# Patient Record
Sex: Male | Born: 1975
Health system: Southern US, Community
[De-identification: ages and names within clinical notes are randomized; demographics above are authoritative.]

## PROBLEM LIST (undated history)

## (undated) ENCOUNTER — Emergency Department (HOSPITAL_COMMUNITY): Payer: 59

## (undated) DIAGNOSIS — Z87442 Personal history of urinary calculi: Secondary | ICD-10-CM

## (undated) DIAGNOSIS — K5792 Diverticulitis of intestine, part unspecified, without perforation or abscess without bleeding: Secondary | ICD-10-CM

## (undated) HISTORY — DX: Diverticulitis of intestine, part unspecified, without perforation or abscess without bleeding: K57.92

## (undated) HISTORY — PX: WISDOM TOOTH EXTRACTION: SHX21

---

## 2018-06-17 ENCOUNTER — Other Ambulatory Visit: Payer: Self-pay

## 2018-06-17 ENCOUNTER — Emergency Department (HOSPITAL_COMMUNITY): Payer: BLUE CROSS/BLUE SHIELD

## 2018-06-17 ENCOUNTER — Encounter (HOSPITAL_COMMUNITY): Payer: Self-pay | Admitting: Emergency Medicine

## 2018-06-17 ENCOUNTER — Emergency Department (HOSPITAL_COMMUNITY)
Admission: EM | Admit: 2018-06-17 | Discharge: 2018-06-17 | Disposition: A | Discharge status: Home / Self Care | Payer: BLUE CROSS/BLUE SHIELD | Attending: Emergency Medicine | Admitting: Emergency Medicine

## 2018-06-17 DIAGNOSIS — F17210 Nicotine dependence, cigarettes, uncomplicated: Secondary | ICD-10-CM | POA: Diagnosis not present

## 2018-06-17 DIAGNOSIS — N132 Hydronephrosis with renal and ureteral calculous obstruction: Secondary | ICD-10-CM | POA: Diagnosis not present

## 2018-06-17 DIAGNOSIS — R1012 Left upper quadrant pain: Secondary | ICD-10-CM | POA: Diagnosis not present

## 2018-06-17 DIAGNOSIS — N201 Calculus of ureter: Secondary | ICD-10-CM | POA: Diagnosis not present

## 2018-06-17 LAB — URINALYSIS, ROUTINE W REFLEX MICROSCOPIC
Bilirubin Urine: NEGATIVE
Glucose, UA: NEGATIVE mg/dL
Ketones, ur: 5 mg/dL — AB
Leukocytes,Ua: NEGATIVE
Nitrite: NEGATIVE
Protein, ur: 30 mg/dL — AB
RBC / HPF: >50 RBC/hpf — ABNORMAL HIGH (ref 0–5)
Specific Gravity, Urine: 1.025 (ref 1.005–1.030)
pH: 8 (ref 5.0–8.0)

## 2018-06-17 MED ORDER — CEPHALEXIN 500 MG PO CAPS
500.0000 mg | ORAL_CAPSULE | Freq: Once | ORAL | Status: AC
  Administered 2018-06-17: 500 mg via ORAL
  Filled 2018-06-17: qty 1

## 2018-06-17 MED ORDER — HYDROMORPHONE HCL 1 MG/ML IJ SOLN
1.0000 mg | Freq: Once | INTRAMUSCULAR | Status: AC
  Administered 2018-06-17: 22:00:00 1 mg via INTRAVENOUS
  Filled 2018-06-17: qty 1

## 2018-06-17 MED ORDER — OXYCODONE-ACETAMINOPHEN 5-325 MG PO TABS: 1.0000 | ORAL_TABLET | ORAL | 0 refills | Status: DC | PRN

## 2018-06-17 MED ORDER — CEPHALEXIN 500 MG PO CAPS: 500.0000 mg | ORAL_CAPSULE | Freq: Four times a day (QID) | ORAL | 0 refills | Status: DC

## 2018-06-17 MED ORDER — HYDROMORPHONE HCL 1 MG/ML IJ SOLN
1.0000 mg | Freq: Once | INTRAMUSCULAR | Status: AC
  Administered 2018-06-17: 1 mg via INTRAVENOUS
  Filled 2018-06-17: qty 1

## 2018-06-17 MED ORDER — ONDANSETRON HCL 4 MG/2ML IJ SOLN
4.0000 mg | Freq: Once | INTRAMUSCULAR | Status: AC
  Administered 2018-06-17: 21:00:00 4 mg via INTRAVENOUS
  Filled 2018-06-17: qty 2

## 2018-06-17 MED ORDER — TAMSULOSIN HCL 0.4 MG PO CAPS: 0.4000 mg | ORAL_CAPSULE | Freq: Every day | ORAL | 0 refills | Status: DC

## 2018-06-17 MED ORDER — ONDANSETRON HCL 4 MG PO TABS: 4.0000 mg | ORAL_TABLET | Freq: Four times a day (QID) | ORAL | 0 refills | Status: DC

## 2018-06-17 MED ORDER — KETOROLAC TROMETHAMINE 30 MG/ML IJ SOLN
30.0000 mg | Freq: Once | INTRAMUSCULAR | Status: AC
  Administered 2018-06-17: 30 mg via INTRAVENOUS
  Filled 2018-06-17: qty 1

## 2018-06-17 NOTE — ED Notes (Signed)
Urine strainer and specimen cup given to patient.

## 2018-06-17 NOTE — ED Provider Notes (Signed)
[facility] EMERGENCY DEPARTMENT Provider Note   CSN: [id number] Arrival date & time: 06/17/18  2039    History   Chief Complaint Chief Complaint  Patient presents with  . Flank Pain    HPI Wayne Peterson is a 43 y.o. male.     HPI   Wayne Peterson is a 43 y.o. male who presents to the Emergency Department complaining of sudden onset of left flank pain this evening at 7:00 pm.  He describes the pain as constant and sharp and also associated with nausea.  He noticed some "pressure" to his testicles associated with urination.  He denies injury, hematuria, difficulty with urination and history of kidney stones.  Nothing makes his symptoms better or worse.  He has not tried any over the counter analgesics prior to arrival.       History reviewed. No pertinent past medical history.  There are no active problems to display for this patient.   History reviewed. No pertinent surgical history.    Home Medications    Prior to Admission medications   Not on File    Family History No family history on file.  Social History Social History   Tobacco Use  . Smoking status: Current Every Day Smoker    Types: Cigarettes  . Smokeless tobacco: Never Used  Substance Use Topics  . Alcohol use: Not Currently  . Drug use: Not Currently     Allergies   Penicillins   Review of Systems Review of Systems  Constitutional: Negative for chills and fever.  Respiratory: Negative for chest tightness and shortness of breath.   Cardiovascular: Negative for chest pain.  Gastrointestinal: Positive for nausea. Negative for abdominal pain and vomiting.  Genitourinary: Positive for flank pain and hematuria. Negative for decreased urine volume, penile swelling, scrotal swelling and testicular pain.  Skin: Negative for rash.  Neurological: Negative for dizziness, weakness and numbness.     Physical Exam Updated Vital Signs BP 126/85 (BP Location: Left Arm)   Pulse 66   Temp 98  F (36.7 C) (Oral)   Resp 18   Ht 5\' 10"  (1.778 m)   Wt 88.5 kg   SpO2 99%   BMI 27.98 kg/m   Physical Exam Vitals signs and nursing note reviewed.  Constitutional:      Comments: Pt is uncomfortable appearing.    Cardiovascular:     Rate and Rhythm: Normal rate and regular rhythm.  Pulmonary:     Effort: Pulmonary effort is normal.     Breath sounds: Normal breath sounds.  Abdominal:     Palpations: Abdomen is soft. There is no mass.     Tenderness: There is no abdominal tenderness. There is no left CVA tenderness.     Comments: Pt points to left flank area as source of pain.  No CVA tenderness.    Musculoskeletal:     Right lower leg: No edema.     Left lower leg: No edema.  Skin:    General: Skin is warm.     Capillary Refill: Capillary refill takes less than 2 seconds.     Findings: No rash.  Neurological:     General: No focal deficit present.     Mental Status: He is alert.      ED Treatments / Results  Labs (all labs ordered are listed, but only abnormal results are displayed) Labs Reviewed  URINALYSIS, ROUTINE W REFLEX MICROSCOPIC - Abnormal; Notable for the following components:  Result Value   APPearance HAZY (*)    Hgb urine dipstick LARGE (*)    Ketones, ur 5 (*)    Protein, ur 30 (*)    RBC / HPF >50 (*)    Bacteria, UA RARE (*)    All other components within normal limits  URINE CULTURE    EKG None  Radiology Ct Renal Stone Study  Result Date: 06/17/2018 CLINICAL DATA:  Left flank pain. EXAM: CT ABDOMEN AND PELVIS WITHOUT CONTRAST TECHNIQUE: Multidetector CT imaging of the abdomen and pelvis was performed following the standard protocol without IV contrast. COMPARISON:  None. FINDINGS: Lower chest: Minor left lower lobe atelectasis. Hepatobiliary: No focal liver abnormality is seen. No gallstones, gallbladder wall thickening, or biliary dilatation. Pancreas: No ductal dilatation or inflammation. Spleen: Normal in size without focal  abnormality. Adrenals/Urinary Tract: Normal adrenal glands. 3 mm obstructing stone at the left ureterovesicular junction with moderate hydroureteronephrosis. Possible underlying extrarenal pelvis configuration with renal pelvis dilatation out of proportion to calyceal dilatation. Mild left perinephric edema. There is a dependent 12 x 14 mm nonobstructing stone in the mid renal pelvis, as well as additional smaller nonobstructing intrarenal stones. Probable extrarenal pelvis of the right kidney without calyceal dilatation. No right perinephric edema or renal stones. Right ureters decompressed. Urinary bladder partially distended. No bladder stone. Stomach/Bowel: Stomach is distended with ingested contents. Appendix appears normal. No evidence of bowel wall thickening, distention, or inflammatory changes. Minimal distal diverticulosis without diverticulitis. Vascular/Lymphatic: Normal caliber abdominal aorta. No enlarged lymph nodes in the abdomen or pelvis. Reproductive: Prostate is unremarkable. Other: No free air free fluid. Small amount of fat in both inguinal canals. Musculoskeletal: There are no acute or suspicious osseous abnormalities. IMPRESSION: 1. Obstructing 3 mm stone at the left ureterovesicular junction with moderate hydroureteronephrosis. 2. Additional nonobstructing stones in the left kidney, including a 12 x 14 mm dependent stone in the renal pelvis. 3. Probable extrarenal pelvis configuration of both kidneys. 4. Minor distal colonic diverticulosis without diverticulitis. Electronically Signed   By: Keith Rake M.D.   On: 06/17/2018 21:44    Procedures Procedures (including critical care time)  Medications Ordered in ED Medications  HYDROmorphone (DILAUDID) injection 1 mg (1 mg Intravenous Given 06/17/18 2104)  ondansetron (ZOFRAN) injection 4 mg (4 mg Intravenous Given 06/17/18 2104)  HYDROmorphone (DILAUDID) injection 1 mg (1 mg Intravenous Given 06/17/18 2144)  ketorolac (TORADOL) 30  MG/ML injection 30 mg (30 mg Intravenous Given 06/17/18 2211)     Initial Impression / Assessment and Plan / ED Course  I have reviewed the triage vital signs and the nursing notes.  Pertinent labs & imaging results that were available during my care of the patient were reviewed by me and considered in my medical decision making (see chart for details).        2220  On recheck, pt reports feeling much better and now rates pain at "0"  Nausea also resolved.  I have discussed CT findings with him and given the presence of a large stone in the renal pelvis, I have recommended that he f/u with urology.  rx written for zofran, percocet and flomax.   Urine strainer dispensed and strict return precautions discussed.    U/a shows some WBC's and rare bacteria.  Urine culture pending.  Will start keflex.    Final Clinical Impressions(s) / ED Diagnoses   Final diagnoses:  Left ureteral stone    ED Discharge Orders    None  Kem Parkinson, PA-C 06/17/18 2347    Fredia Sorrow, MD 06/20/18 [phone]

## 2018-06-17 NOTE — ED Notes (Signed)
Patient returned from CT scan.

## 2018-06-17 NOTE — Discharge Instructions (Signed)
Take the medications as directed.  Strain all urine.  Call the urologist listed on Monday to arrange a follow-up appt.  Return here for any worsening symptoms

## 2018-06-17 NOTE — ED Triage Notes (Signed)
Pt with c/o L. Flank pain that started around 7pm tonight. Pt also c/o nausea.

## 2018-06-20 MED FILL — Oxycodone w/ Acetaminophen Tab 5-325 MG: ORAL | Qty: 6 | Status: AC

## 2018-06-21 DIAGNOSIS — N130 Hydronephrosis with ureteropelvic junction obstruction: Secondary | ICD-10-CM | POA: Diagnosis not present

## 2018-06-21 DIAGNOSIS — N202 Calculus of kidney with calculus of ureter: Secondary | ICD-10-CM | POA: Diagnosis not present

## 2018-07-19 DIAGNOSIS — N130 Hydronephrosis with ureteropelvic junction obstruction: Secondary | ICD-10-CM | POA: Diagnosis not present

## 2018-07-19 DIAGNOSIS — N202 Calculus of kidney with calculus of ureter: Secondary | ICD-10-CM | POA: Diagnosis not present

## 2018-07-21 ENCOUNTER — Other Ambulatory Visit: Payer: Self-pay | Admitting: Urology

## 2018-08-23 ENCOUNTER — Other Ambulatory Visit (HOSPITAL_COMMUNITY)
Admission: RE | Admit: 2018-08-23 | Discharge: 2018-08-23 | Disposition: A | Discharge status: Home / Self Care | Payer: BC Managed Care – PPO | Source: Ambulatory Visit | Attending: Urology | Admitting: Urology

## 2018-08-23 ENCOUNTER — Other Ambulatory Visit (HOSPITAL_COMMUNITY): Payer: BC Managed Care – PPO

## 2018-08-23 DIAGNOSIS — Z1159 Encounter for screening for other viral diseases: Secondary | ICD-10-CM | POA: Insufficient documentation

## 2018-08-24 ENCOUNTER — Encounter (HOSPITAL_BASED_OUTPATIENT_CLINIC_OR_DEPARTMENT_OTHER): Payer: Self-pay | Admitting: *Deleted

## 2018-08-24 ENCOUNTER — Other Ambulatory Visit: Payer: Self-pay

## 2018-08-24 LAB — NOVEL CORONAVIRUS, NAA (HOSP ORDER, SEND-OUT TO REF LAB; TAT 18-24 HRS): SARS-CoV-2, NAA: NOT DETECTED

## 2018-08-24 NOTE — Progress Notes (Signed)
Spoke with Ledger Npo after midnight  arrive 1040 am 08-26-2018 [facility] No meds to take Has surgery orders in epic  I stat 8 needed Driver wife erica cell [phone]

## 2018-08-26 ENCOUNTER — Encounter (HOSPITAL_BASED_OUTPATIENT_CLINIC_OR_DEPARTMENT_OTHER): Payer: Self-pay | Admitting: Emergency Medicine

## 2018-08-26 ENCOUNTER — Ambulatory Visit (HOSPITAL_BASED_OUTPATIENT_CLINIC_OR_DEPARTMENT_OTHER): Payer: BC Managed Care – PPO | Admitting: Anesthesiology

## 2018-08-26 ENCOUNTER — Ambulatory Visit (HOSPITAL_BASED_OUTPATIENT_CLINIC_OR_DEPARTMENT_OTHER)
Admission: RE | Admit: 2018-08-26 | Discharge: 2018-08-26 | Disposition: A | Discharge status: Home / Self Care | Payer: BC Managed Care – PPO | Attending: Urology | Admitting: Urology

## 2018-08-26 ENCOUNTER — Other Ambulatory Visit: Payer: Self-pay

## 2018-08-26 ENCOUNTER — Encounter (HOSPITAL_BASED_OUTPATIENT_CLINIC_OR_DEPARTMENT_OTHER)
Admission: RE | Disposition: A | Discharge status: Home / Self Care | Payer: Self-pay | Source: Home / Self Care | Attending: Urology

## 2018-08-26 DIAGNOSIS — N202 Calculus of kidney with calculus of ureter: Secondary | ICD-10-CM | POA: Diagnosis not present

## 2018-08-26 DIAGNOSIS — N132 Hydronephrosis with renal and ureteral calculous obstruction: Secondary | ICD-10-CM | POA: Diagnosis not present

## 2018-08-26 DIAGNOSIS — F17210 Nicotine dependence, cigarettes, uncomplicated: Secondary | ICD-10-CM | POA: Diagnosis not present

## 2018-08-26 HISTORY — PX: CYSTOSCOPY WITH RETROGRADE PYELOGRAM, URETEROSCOPY AND STENT PLACEMENT: SHX5789

## 2018-08-26 HISTORY — DX: Personal history of urinary calculi: Z87.442

## 2018-08-26 HISTORY — PX: HOLMIUM LASER APPLICATION: SHX5852

## 2018-08-26 LAB — POCT I-STAT, CHEM 8
BUN: 7 mg/dL (ref 6–20)
Calcium, Ion: 1.25 mmol/L (ref 1.15–1.40)
Chloride: 105 mmol/L (ref 98–111)
Creatinine, Ser: 1 mg/dL (ref 0.61–1.24)
Glucose, Bld: 97 mg/dL (ref 70–99)
HCT: 43 % (ref 39.0–52.0)
Hemoglobin: 14.6 g/dL (ref 13.0–17.0)
Potassium: 4 mmol/L (ref 3.5–5.1)
Sodium: 140 mmol/L (ref 135–145)
TCO2: 24 mmol/L (ref 22–32)

## 2018-08-26 SURGERY — CYSTOURETEROSCOPY, WITH RETROGRADE PYELOGRAM AND STENT INSERTION
Anesthesia: General | Site: Urethra | Laterality: Left

## 2018-08-26 MED ORDER — CIPROFLOXACIN HCL 500 MG PO TABS: 500.0000 mg | ORAL_TABLET | Freq: Two times a day (BID) | ORAL | 0 refills | Status: AC

## 2018-08-26 MED ORDER — LIDOCAINE 2% (20 MG/ML) 5 ML SYRINGE
INTRAMUSCULAR | Status: DC | PRN
  Administered 2018-08-26: 100 mg via INTRAVENOUS

## 2018-08-26 MED ORDER — KETOROLAC TROMETHAMINE 30 MG/ML IJ SOLN
INTRAMUSCULAR | Status: DC | PRN
  Administered 2018-08-26: 30 mg via INTRAVENOUS

## 2018-08-26 MED ORDER — LIDOCAINE 2% (20 MG/ML) 5 ML SYRINGE
INTRAMUSCULAR | Status: AC
  Filled 2018-08-26: qty 5

## 2018-08-26 MED ORDER — GENTAMICIN SULFATE 40 MG/ML IJ SOLN
5.0000 mg/kg | INTRAVENOUS | Status: DC
  Filled 2018-08-26: qty 11

## 2018-08-26 MED ORDER — GENTAMICIN SULFATE 40 MG/ML IJ SOLN
5.0000 mg/kg | INTRAVENOUS | Status: AC
  Administered 2018-08-26: 400.4 mg via INTRAVENOUS
  Filled 2018-08-26: qty 10

## 2018-08-26 MED ORDER — MIDAZOLAM HCL 2 MG/2ML IJ SOLN
INTRAMUSCULAR | Status: AC
  Filled 2018-08-26: qty 2

## 2018-08-26 MED ORDER — FENTANYL CITRATE (PF) 100 MCG/2ML IJ SOLN
25.0000 ug | INTRAMUSCULAR | Status: DC | PRN
  Filled 2018-08-26: qty 1

## 2018-08-26 MED ORDER — PROPOFOL 10 MG/ML IV BOLUS
INTRAVENOUS | Status: AC
  Filled 2018-08-26: qty 20

## 2018-08-26 MED ORDER — OXYCODONE-ACETAMINOPHEN 5-325 MG PO TABS: 1.0000 | ORAL_TABLET | Freq: Four times a day (QID) | ORAL | 0 refills | Status: DC | PRN

## 2018-08-26 MED ORDER — DEXAMETHASONE SODIUM PHOSPHATE 10 MG/ML IJ SOLN
INTRAMUSCULAR | Status: AC
  Filled 2018-08-26: qty 1

## 2018-08-26 MED ORDER — PROPOFOL 10 MG/ML IV BOLUS
INTRAVENOUS | Status: DC | PRN
  Administered 2018-08-26: 200 mg via INTRAVENOUS

## 2018-08-26 MED ORDER — LACTATED RINGERS IV SOLN
INTRAVENOUS | Status: DC
  Administered 2018-08-26: 12:00:00 via INTRAVENOUS
  Filled 2018-08-26: qty 1000

## 2018-08-26 MED ORDER — FENTANYL CITRATE (PF) 100 MCG/2ML IJ SOLN
INTRAMUSCULAR | Status: AC
  Filled 2018-08-26: qty 2

## 2018-08-26 MED ORDER — MIDAZOLAM HCL 5 MG/5ML IJ SOLN
INTRAMUSCULAR | Status: DC | PRN
  Administered 2018-08-26: 2 mg via INTRAVENOUS

## 2018-08-26 MED ORDER — DEXAMETHASONE SODIUM PHOSPHATE 10 MG/ML IJ SOLN
INTRAMUSCULAR | Status: DC | PRN
  Administered 2018-08-26: 10 mg via INTRAVENOUS

## 2018-08-26 MED ORDER — OXYCODONE HCL 5 MG PO TABS
5.0000 mg | ORAL_TABLET | Freq: Once | ORAL | Status: DC | PRN
  Filled 2018-08-26: qty 1

## 2018-08-26 MED ORDER — KETOROLAC TROMETHAMINE 30 MG/ML IJ SOLN
INTRAMUSCULAR | Status: AC
  Filled 2018-08-26: qty 1

## 2018-08-26 MED ORDER — FENTANYL CITRATE (PF) 100 MCG/2ML IJ SOLN
INTRAMUSCULAR | Status: DC | PRN
  Administered 2018-08-26 (×2): 50 ug via INTRAVENOUS

## 2018-08-26 MED ORDER — SENNOSIDES-DOCUSATE SODIUM 8.6-50 MG PO TABS: 1.0000 | ORAL_TABLET | Freq: Two times a day (BID) | ORAL | 0 refills | Status: DC

## 2018-08-26 MED ORDER — OXYCODONE HCL 5 MG/5ML PO SOLN
5.0000 mg | Freq: Once | ORAL | Status: DC | PRN
  Filled 2018-08-26: qty 5

## 2018-08-26 MED ORDER — IOHEXOL 300 MG/ML  SOLN
INTRAMUSCULAR | Status: DC | PRN
  Administered 2018-08-26: 13:00:00 20 mL via URETHRAL

## 2018-08-26 MED ORDER — ONDANSETRON HCL 4 MG/2ML IJ SOLN
INTRAMUSCULAR | Status: AC
  Filled 2018-08-26: qty 2

## 2018-08-26 MED ORDER — ONDANSETRON HCL 4 MG/2ML IJ SOLN
4.0000 mg | Freq: Once | INTRAMUSCULAR | Status: DC | PRN
  Filled 2018-08-26: qty 2

## 2018-08-26 MED ORDER — KETOROLAC TROMETHAMINE 10 MG PO TABS: 10.0000 mg | ORAL_TABLET | Freq: Four times a day (QID) | ORAL | 0 refills | Status: DC | PRN

## 2018-08-26 MED ORDER — ONDANSETRON HCL 4 MG/2ML IJ SOLN
INTRAMUSCULAR | Status: DC | PRN
  Administered 2018-08-26: 4 mg via INTRAVENOUS

## 2018-08-26 SURGICAL SUPPLY — 28 items
BAG DRAIN URO-CYSTO SKYTR STRL (DRAIN) ×4 IMPLANT
BASKET LASER NITINOL 1.9FR (BASKET) ×4 IMPLANT
CATH INTERMIT  6FR 70CM (CATHETERS) ×4 IMPLANT
CLOTH BEACON ORANGE TIMEOUT ST (SAFETY) ×4 IMPLANT
FIBER LASER FLEXIVA 365 (UROLOGICAL SUPPLIES) IMPLANT
FIBER LASER TRAC TIP (UROLOGICAL SUPPLIES) ×4 IMPLANT
GLOVE BIO SURGEON STRL SZ7.5 (GLOVE) ×8 IMPLANT
GLOVE BIOGEL PI IND STRL 6.5 (GLOVE) ×2 IMPLANT
GLOVE BIOGEL PI IND STRL 7.5 (GLOVE) ×2 IMPLANT
GLOVE BIOGEL PI INDICATOR 6.5 (GLOVE) ×2
GLOVE BIOGEL PI INDICATOR 7.5 (GLOVE) ×2
GOWN STRL REUS W/TWL LRG LVL3 (GOWN DISPOSABLE) ×12 IMPLANT
GUIDEWIRE ANG ZIPWIRE 038X150 (WIRE) ×4 IMPLANT
GUIDEWIRE STR DUAL SENSOR (WIRE) ×4 IMPLANT
IV NS 1000ML (IV SOLUTION)
IV NS 1000ML BAXH (IV SOLUTION) IMPLANT
IV NS IRRIG 3000ML ARTHROMATIC (IV SOLUTION) ×8 IMPLANT
KIT TURNOVER CYSTO (KITS) ×4 IMPLANT
MANIFOLD NEPTUNE II (INSTRUMENTS) ×4 IMPLANT
NS IRRIG 500ML POUR BTL (IV SOLUTION) ×4 IMPLANT
PACK CYSTO (CUSTOM PROCEDURE TRAY) ×4 IMPLANT
SHEATH URETERAL 12FRX35CM (MISCELLANEOUS) ×4 IMPLANT
STENT POLARIS 5FRX26 (STENTS) ×4 IMPLANT
SYR 10ML LL (SYRINGE) ×4 IMPLANT
TUBE CONNECTING 12'X1/4 (SUCTIONS) ×1
TUBE CONNECTING 12X1/4 (SUCTIONS) ×3 IMPLANT
TUBE FEEDING 8FR 16IN STR KANG (MISCELLANEOUS) ×4 IMPLANT
TUBING UROLOGY SET (TUBING) ×4 IMPLANT

## 2018-08-26 NOTE — Progress Notes (Signed)
Discharge instructions started at 1500 and completed prior to discharge.

## 2018-08-26 NOTE — Transfer of Care (Signed)
Immediate Anesthesia Transfer of Care Note  Patient: Wayne Peterson  Procedure(s) Performed: CYSTOSCOPY WITH BILATERAL RETROGRADE PYELOGRAM, LEFT URETEROSCOPY AND LEFT STENT PLACEMENT (Urethra) HOLMIUM LASER APPLICATION (Left Urethra)  Patient Location: PACU  Anesthesia Type:General  Level of Consciousness: awake, alert  and oriented  Airway & Oxygen Therapy: Patient Spontanous Breathing and Patient connected to nasal cannula oxygen  Post-op Assessment: Report given to RN  Post vital signs: Reviewed and stable  Last Vitals: 134/80, 18, 98.2 Vitals Value Taken Time  BP    Temp    Pulse 77 08/26/18 1406  Resp    SpO2 98 % 08/26/18 1406  Vitals shown include unvalidated device data.  Last Pain:  Vitals:   08/26/18 1142  TempSrc: Oral      Patients Stated Pain Goal: 4 (29/56/21 3086)  Complications: No apparent anesthesia complications

## 2018-08-26 NOTE — Anesthesia Preprocedure Evaluation (Signed)
Anesthesia Evaluation  Patient identified by MRN, date of birth, ID band Patient awake    Reviewed: Allergy & Precautions, NPO status , Patient's Chart, lab work & pertinent test results  History of Anesthesia Complications Negative for: history of anesthetic complications  Airway Mallampati: III  TM Distance: >3 FB Neck ROM: Full    Dental  (+) Teeth Intact   Pulmonary neg pulmonary ROS, Current Smoker,    Pulmonary exam normal        Cardiovascular negative cardio ROS Normal cardiovascular exam     Neuro/Psych negative neurological ROS  negative psych ROS   GI/Hepatic negative GI ROS, Neg liver ROS,   Endo/Other  negative endocrine ROS  Renal/GU negative Renal ROS  negative genitourinary   Musculoskeletal negative musculoskeletal ROS (+)   Abdominal   Peds  Hematology negative hematology ROS (+)   Anesthesia Other Findings   Reproductive/Obstetrics                             Anesthesia Physical Anesthesia Plan  ASA: I  Anesthesia Plan: General   Post-op Pain Management:    Induction: Intravenous  PONV Risk Score and Plan: 2 and Ondansetron, Dexamethasone, Midazolam and Treatment may vary due to age or medical condition  Airway Management Planned: LMA  Additional Equipment: None  Intra-op Plan:   Post-operative Plan: Extubation in OR  Informed Consent: I have reviewed the patients History and Physical, chart, labs and discussed the procedure including the risks, benefits and alternatives for the proposed anesthesia with the patient or authorized representative who has indicated his/her understanding and acceptance.     Dental advisory given  Plan Discussed with:   Anesthesia Plan Comments:         Anesthesia Quick Evaluation

## 2018-08-26 NOTE — Anesthesia Postprocedure Evaluation (Signed)
Anesthesia Post Note  Patient: Wayne Peterson  Procedure(s) Performed: CYSTOSCOPY WITH BILATERAL RETROGRADE PYELOGRAM, LEFT URETEROSCOPY AND LEFT STENT PLACEMENT (Urethra) HOLMIUM LASER APPLICATION (Left Urethra)     Patient location during evaluation: PACU Anesthesia Type: General Level of consciousness: awake and alert Pain management: pain level controlled Vital Signs Assessment: post-procedure vital signs reviewed and stable Respiratory status: spontaneous breathing, nonlabored ventilation and respiratory function stable Cardiovascular status: blood pressure returned to baseline and stable Postop Assessment: no apparent nausea or vomiting Anesthetic complications: no    Last Vitals:  Vitals:   08/26/18 1404 08/26/18 1415  BP: 134/80 121/78  Pulse: 74 65  Resp: 16 17  Temp: 36.8 C   SpO2: 99% 100%    Last Pain:  Vitals:   08/26/18 1404  TempSrc:   PainSc: 0-No pain                 Lidia Collum

## 2018-08-26 NOTE — Op Note (Signed)
NAME: Wayne Peterson, Wayne Peterson MEDICAL RECORD [id number] ACCOUNT [id number] DATE OF BIRTH:08-25-1975 FACILITY: [facility] LOCATION: [facility] PHYSICIAN:Caya Soberanis, MD  OPERATIVE REPORT  DATE OF PROCEDURE:  08/26/2018  PREOPERATIVE DIAGNOSIS:  Left distal ureteral stone and large left renal stone.  POSTOPERATIVE DIAGNOSIS:  Left distal ureteral stone and large left renal stone.  PROCEDURE: 1.  Cystoscopy, left retrograde pyelogram, interpretation. 2.  Left ureteroscopy with laser lithotripsy. 3.  Insertion of left ureteral stent 5 x 26 Polaris, no tether. 4.  Right retrograde pyelogram.  ESTIMATED BLOOD LOSS:  Nil.  COMPLICATIONS:  None.  SPECIMENS:  Left renal and ureteral stone fragments for composition analysis.  FINDINGS: 1.  Left greater than right partial UPJ obstruction, not high grade. 2.  Left distal ureteral stone, large left intrarenal stone. 3.  Complete resolution of all accessible stone fragments larger than 130 mm following laser lithotripsy and basket extraction. 4.  Successful placement of left ureteral stent, proximal end in pelvis, distal end in urinary bladder, no tether.  INDICATIONS:  The patient is a pleasant 43 year old gentleman who was found on workup for colicky flank pain to have a distal left ureteral stone approximately 3 mm and a significant left renal stone over a centimeter.  He also had a left greater than  right extrarenal pelvis consistent with likely partial UPJ obstruction, but no history of compromised renal function.  Options were discussed for management including medical therapy alone versus lithotripsy versus ureteroscopy, and he wished to proceed  with the latter with left side stone free his goal.  Informed consent was obtained and placed in the medical record.  PROCEDURE IN DETAIL:  The patient being identified, the right being left ureteroscopic stimulation and right retrograde pyelogram was confirmed.  Procedure time-out was performed.   Intravenous antibiotics were administered.  General anesthesia was  induced.  The patient was placed into a low lithotomy position.  A sterile field was created by prepping and draping the base of the penis, perineum and proximal thighs using iodine.  Cystourethroscopy was performed with a 21-French rigid cystoscope with  offset lens.  Inspection of anterior and posterior urethra was unremarkable.  Inspection of bladder revealed bilateral ureteral orifices.  The left ureteral orifice was somewhat inflamed with some surrounding edema consistent with likely persistence of  left distal ureteral stone.  The right ureteral orifice was cannulated with a 6-French renal catheter, and right retrograde pyelogram was obtained.  Right retrograde pyelogram demonstrated a single right ureter, single-system right kidney.  There was some caliectasis without filling defects or narrowing consistent with likely partial right UPJ.  Similarly, left retrograde pyelogram was obtained.  Left retrograde pyelogram demonstrated a single left ureter, single-system left kidney.  There was mild hydroureteronephrosis to the level of the distal ureter with a filling defect in the intramural distal ureter consistent with known stone.  There was  a calcification turned filling defect in the renal pelvis consistent with a large renal stone.  There was more impressive likely partial UPJ physiology on the left.  An 0.03 ZIPwire was advanced to the level of the upper pole and set aside as a safety  wire and eventually be placed in the urinary bladder for pressure release.  Semirigid ureteroscopy of the distal left ureter alongside a separate sensor working wire.  As expected, within the proximal intramural ureter, there was an impacted stone.  It  was just too large for simple basketing.  As such, holmium laser energy applied at settings of 0.2 joules  and 20 Hz and fragmented into smaller pieces that were then sequentially grasped and removed  and set aside for comp analysis.  Repeat semirigid  ureteroscopy of the distal four-fifths of the left ureter revealed no additional calcifications or significant mucosal lesions.  The semirigid scope was then exchanged for a 12/14 medium length ureteral access sheath using continuous fluoroscopic  guidance to the level of the proximal left ureter, and flexible digital ureteroscopy performed on the proximal left ureter and systematic inspection of the kidney using dual-channel flexible digital ureteroscope.  There was a dominant intrarenal calculus  noted within the renal pelvis, likely intermittent ball-valving.  This was much too large for simple basketing.  It was basketed and repositioned into an upper pole calix for less acute angulation, and holmium laser energy was applied to the stone using  a variety of settings, beginning at 0.2 joules and 20 Hz and ending at 0.4 joules and 30 Hz.  Using a dusting technique, the entire stone was ablated into fragments 130 mm or less in diameter.  Representative fragments were basketed with the escape  basket and removed and set aside for composition analysis.  Following this, excellent hemostasis was complete with resolution of all successful stone fragments larger than 130 mm.  An access sheath was removed under continuous vision, and again no  evidence of high-grade obstruction was seen.  Given dusting technique and large stone, it was felt that interval stenting with a nontethered stent would be warranted.  As such, a new 5 x 26 Polaris-type stent was placed with remaining safety wire using  fluoroscopic guidance.  Good proximal and distal planes were noted, and the procedure was terminated.  The patient tolerated the procedure well.  No immediate perioperative complications.  The patient was taken to the postanesthesia care unit in stable  condition with plan for discharge home.  LN/NUANCE  D:08/26/2018 T:08/26/2018 JOB:[id number]

## 2018-08-26 NOTE — Brief Op Note (Signed)
08/26/2018  1:58 PM  PATIENT:  Alcide Clever II  43 y.o. male  PRE-OPERATIVE DIAGNOSIS:  LEFT URETERAL / RENAL STONES  POST-OPERATIVE DIAGNOSIS:  LEFT URETERAL / RENAL STONE  PROCEDURE:  Procedure(s): CYSTOSCOPY WITH BILATERAL RETROGRADE PYELOGRAM, LEFT URETEROSCOPY AND LEFT STENT PLACEMENT HOLMIUM LASER APPLICATION (Left)  SURGEON:  Surgeon(s) and Role:    Alexis Frock, MD - Primary  PHYSICIAN ASSISTANT:   ASSISTANTS: none   ANESTHESIA:   general  EBL:  minimal   BLOOD ADMINISTERED:none  DRAINS: none   LOCAL MEDICATIONS USED:  NONE  SPECIMEN:  Source of Specimen:  left renal / ureteral stone fragments  DISPOSITION OF SPECIMEN:  [facility] for compositional analysis  COUNTS:  YES  TOURNIQUET:  * No tourniquets in log *  DICTATION: .Other Dictation: Dictation Number  [id number]  PLAN OF CARE: Discharge to home after PACU  PATIENT DISPOSITION:  PACU - hemodynamically stable.   Delay start of Pharmacological VTE agent (>24hrs) due to surgical blood loss or risk of bleeding: yes

## 2018-08-26 NOTE — H&P (Signed)
Urology Admission H&P  Chief Complaint: Pre-OP LEFT Ureteroscopic Stone Manipulation and BILATERAL retrogrades.   History of Present Illness:   1 - Left Ureteral / Renal Stones - Left 78mm UVJ + 75mm renal pelvis + 30mm lower pole stones by ER CT 06/2018 on eval flank pain. Density 900HU. Given tamsulosin, pain meds, nausea meds, strainer and pain now well controlle but no passage and persistent on KUB.   2 - L>R Bilateral UPJ Obstruction v. Extrarenal Pelvis - large L>R hydro to UPJ bilaterall by ER CT 06/2018. No prior renal function measures. No obvious crossing vessels.   PMH UNremarkable. NO prior surgeyr. NO CV disease / blood thinners. His PCP is [facility] in [place].   Today " Wayne Peterson " is seen to proceed with LEFT ureteroscopy and BILATERAL retrogrades. No intervl fevers. Most recent UA without infectious parameters.     Past Medical History:  Diagnosis Date  . History of kidney stones    Past Surgical History:  Procedure Laterality Date  . WISDOM TOOTH EXTRACTION      Home Medications:  No current facility-administered medications for this encounter.    Current Outpatient Medications  Medication Sig Dispense Refill Last Dose  . ondansetron (ZOFRAN) 4 MG tablet Take 1 tablet (4 mg total) by mouth every 6 (six) hours. 12 tablet 0   . oxyCODONE-acetaminophen (PERCOCET/ROXICET) 5-325 MG tablet Take 1 tablet by mouth every 4 (four) hours as needed for severe pain. 6 tablet 0    Allergies:  Allergies  Allergen Reactions  . Penicillins     n/v    History reviewed. No pertinent family history. Social History:  reports that he has been smoking cigarettes. He has a 11.50 pack-year smoking history. He has never used smokeless tobacco. He reports previous alcohol use. He reports previous drug use. Drug: Marijuana.  Review of Systems  Constitutional: Negative.  Negative for chills and fever.  Genitourinary: Positive for flank pain.  All other systems reviewed and are  negative.   Physical Exam:  Vital signs in last 24 hours:   Physical Exam  Constitutional: He appears well-developed.  Eyes: Pupils are equal, round, and reactive to light.  Neck: Normal range of motion.  Cardiovascular: Normal rate.  Respiratory: Effort normal.  GI: Soft.  Genitourinary:    Genitourinary Comments: Mild LEFT CVAT   Musculoskeletal: Normal range of motion.  Neurological: He is alert.  Skin: Skin is warm.    Laboratory Data:  No results found for this or any previous visit (from the past 24 hour(s)). Recent Results (from the past 240 hour(s))  Novel Coronavirus, NAA (hospital order; send-out to ref lab)     Status: None   Collection Time: 08/23/18  2:54 PM   Specimen: Nasopharyngeal Swab; Respiratory  Result Value Ref Range Status   SARS-CoV-2, NAA NOT DETECTED NOT DETECTED Final    Comment: (NOTE) This test was developed and its performance characteristics determined by [employer]. This test has not been FDA cleared or approved. This test has been authorized by FDA under an Emergency Use Authorization (EUA). This test is only authorized for the duration of time the declaration that circumstances exist justifying the authorization of the emergency use of in vitro diagnostic tests for detection of SARS-CoV-2 virus and/or diagnosis of COVID-19 infection under section 564(b)(1) of the Act, 21 U.S.C. 161WRU-0(A)(5), unless the authorization is terminated or revoked sooner. When diagnostic testing is negative, the possibility of a false negative result should be considered in the context of  a patient's recent exposures and the presence of clinical signs and symptoms consistent with COVID-19. An individual without symptoms of COVID-19 and who is not shedding SARS-CoV-2 virus would expect to have a negative (not detected) result in this assay. Performed  At: [facility] [address] [place], [place] [id number] Rush Farmer MD  JG:[id number]    [place]  Final    Comment: Performed at [facility], [place] [address], [place], [place] [address]     Plan:   Proceed as planned with LEFT ureteroscopy and BILATERAL retrogrades. Risks, benefits, alternatives, expected peri-op course discussed previously and reiteratd otday.   Alexis Frock 08/26/2018, 7:19 AM

## 2018-08-26 NOTE — Anesthesia Procedure Notes (Signed)
Procedure Name: LMA Insertion Date/Time: 08/26/2018 12:28 PM Performed by: Bonney Aid, CRNA Pre-anesthesia Checklist: Patient identified, Emergency Drugs available, Suction available and Patient being monitored Patient Re-evaluated:Patient Re-evaluated prior to induction Oxygen Delivery Method: Circle system utilized Preoxygenation: Pre-oxygenation with 100% oxygen Induction Type: IV induction Ventilation: Mask ventilation without difficulty LMA: LMA inserted LMA Size: 5.0 Number of attempts: 1 Airway Equipment and Method: Bite block Placement Confirmation: positive ETCO2 Tube secured with: Tape Dental Injury: Teeth and Oropharynx as per pre-operative assessment

## 2018-08-26 NOTE — Discharge Instructions (Signed)
1 - You may have urinary urgency (bladder spasms) and bloody urine on / off with stent in place. This is normal. ° °2 - Call MD or go to ER for fever >102, severe pain / nausea / vomiting not relieved by medications, or acute change in medical status ° °CYSTOSCOPY HOME CARE INSTRUCTIONS ° °Activity: °Rest for the remainder of the day.  Do not drive or operate equipment today.  You may resume normal activities in one to two days as instructed by your physician.  ° °Meals: °Drink plenty of liquids and eat light foods such as gelatin or soup this evening.  You may return to a normal meal plan tomorrow. ° °Return to Work: °You may return to work in one to two days or as instructed by your physician. ° °Special Instructions / Symptoms: °Call your physician if any of these symptoms occur: ° ° -persistent or heavy bleeding ° -bleeding which continues after first few urination ° -large blood clots that are difficult to pass ° -urine stream diminishes or stops completely ° -fever equal to or higher than 101 degrees Farenheit. ° -cloudy urine with a strong, foul odor ° -severe pain ° °Females should always wipe from front to back after elimination.  You may feel some burning pain when you urinate.  This should disappear with time.  Applying moist heat to the lower abdomen or a hot tub bath may help relieve the pain. \ ° °Follow-Up / Date of Return Visit to Your Physician:  As instructed °Call for an appointment to arrange follow-up. ° °Patient Signature:  ________________________________________________________ ° °Nurse's Signature:  ________________________________________________________ ° °Post Anesthesia Home Care Instructions ° °Activity: °Get plenty of rest for the remainder of the day. A responsible individual must stay with you for 24 hours following the procedure.  °For the next 24 hours, DO NOT: °-Drive a car °-Operate machinery °-Drink alcoholic beverages °-Take any medication unless instructed by your  physician °-Make any legal decisions or sign important papers. ° °Meals: °Start with liquid foods such as gelatin or soup. Progress to regular foods as tolerated. Avoid greasy, spicy, heavy foods. If nausea and/or vomiting occur, drink only clear liquids until the nausea and/or vomiting subsides. Call your physician if vomiting continues. ° °Special Instructions/Symptoms: °Your throat may feel dry or sore from the anesthesia or the breathing tube placed in your throat during surgery. If this causes discomfort, gargle with warm salt water. The discomfort should disappear within 24 hours. ° °If you had a scopolamine patch placed behind your ear for the management of post- operative nausea and/or vomiting: ° °1. The medication in the patch is effective for 72 hours, after which it should be removed.  Wrap patch in a tissue and discard in the trash. Wash hands thoroughly with soap and water. °2. You may remove the patch earlier than 72 hours if you experience unpleasant side effects which may include dry mouth, dizziness or visual disturbances. °3. Avoid touching the patch. Wash your hands with soap and water after contact with the patch. °   ° ° °

## 2018-08-29 ENCOUNTER — Encounter (HOSPITAL_BASED_OUTPATIENT_CLINIC_OR_DEPARTMENT_OTHER): Payer: Self-pay | Admitting: Urology

## 2018-09-01 DIAGNOSIS — N202 Calculus of kidney with calculus of ureter: Secondary | ICD-10-CM | POA: Diagnosis not present

## 2018-09-12 DIAGNOSIS — N202 Calculus of kidney with calculus of ureter: Secondary | ICD-10-CM | POA: Diagnosis not present

## 2018-09-12 DIAGNOSIS — N130 Hydronephrosis with ureteropelvic junction obstruction: Secondary | ICD-10-CM | POA: Diagnosis not present

## 2018-09-14 ENCOUNTER — Other Ambulatory Visit (HOSPITAL_COMMUNITY): Payer: Self-pay | Admitting: Urology

## 2018-09-14 ENCOUNTER — Other Ambulatory Visit: Payer: Self-pay | Admitting: Urology

## 2018-09-14 DIAGNOSIS — N310 Uninhibited neuropathic bladder, not elsewhere classified: Secondary | ICD-10-CM

## 2018-10-28 ENCOUNTER — Encounter (HOSPITAL_COMMUNITY): Payer: BC Managed Care – PPO

## 2018-10-28 ENCOUNTER — Encounter (HOSPITAL_COMMUNITY): Payer: Self-pay

## 2019-02-16 ENCOUNTER — Other Ambulatory Visit: Payer: Self-pay

## 2019-02-16 DIAGNOSIS — Z20822 Contact with and (suspected) exposure to covid-19: Secondary | ICD-10-CM

## 2019-02-17 LAB — NOVEL CORONAVIRUS, NAA: SARS-CoV-2, NAA: NOT DETECTED

## 2019-03-15 ENCOUNTER — Encounter (HOSPITAL_COMMUNITY): Payer: Self-pay | Admitting: Emergency Medicine

## 2019-03-15 ENCOUNTER — Ambulatory Visit (HOSPITAL_COMMUNITY)
Admission: EM | Admit: 2019-03-15 | Discharge: 2019-03-15 | Disposition: A | Discharge status: Home / Self Care | Payer: BC Managed Care – PPO | Attending: Family Medicine | Admitting: Family Medicine

## 2019-03-15 ENCOUNTER — Other Ambulatory Visit: Payer: Self-pay

## 2019-03-15 DIAGNOSIS — Z20828 Contact with and (suspected) exposure to other viral communicable diseases: Secondary | ICD-10-CM | POA: Diagnosis not present

## 2019-03-15 DIAGNOSIS — B349 Viral infection, unspecified: Secondary | ICD-10-CM | POA: Diagnosis not present

## 2019-03-15 DIAGNOSIS — U071 COVID-19: Secondary | ICD-10-CM | POA: Diagnosis not present

## 2019-03-15 DIAGNOSIS — F17210 Nicotine dependence, cigarettes, uncomplicated: Secondary | ICD-10-CM | POA: Insufficient documentation

## 2019-03-15 DIAGNOSIS — R6883 Chills (without fever): Secondary | ICD-10-CM

## 2019-03-15 DIAGNOSIS — R52 Pain, unspecified: Secondary | ICD-10-CM | POA: Diagnosis not present

## 2019-03-15 DIAGNOSIS — R509 Fever, unspecified: Secondary | ICD-10-CM

## 2019-03-15 NOTE — ED Provider Notes (Signed)
[place]   MRN: [id number] DOB: 1975/03/30  Subjective:   Wayne Peterson is a 44 y.o. male presenting for several day history of mild intermittent fever, chills, body aches.  Patient has had multiple COVID-19 contacts including his father-in-law, his daughter.  Has not needed to take medications for relief.   Allergies  Allergen Reactions  . Penicillins     n/v    Past Medical History:  Diagnosis Date  . History of kidney stones      Past Surgical History:  Procedure Laterality Date  . CYSTOSCOPY WITH RETROGRADE PYELOGRAM, URETEROSCOPY AND STENT PLACEMENT  08/26/2018   Procedure: CYSTOSCOPY WITH BILATERAL RETROGRADE PYELOGRAM, LEFT URETEROSCOPY AND LEFT STENT PLACEMENT;  Surgeon: Alexis Frock, MD;  Location: [facility];  Service: Urology;;  . HOLMIUM LASER APPLICATION Left [id number]   Procedure: HOLMIUM LASER APPLICATION;  Surgeon: Alexis Frock, MD;  Location: [facility];  Service: Urology;  Laterality: Left;  . WISDOM TOOTH EXTRACTION      No family history on file.  Social History   Tobacco Use  . Smoking status: Current Every Day Smoker    Packs/day: 0.50    Years: 23.00    Pack years: 11.50    Types: Cigarettes  . Smokeless tobacco: Never Used  Substance Use Topics  . Alcohol use: Not Currently  . Drug use: Not Currently    Types: Marijuana    Comment: marijuana "hemp" last used yesterday    Review of Systems  Constitutional: Positive for chills and fever. Negative for malaise/fatigue.  HENT: Negative for congestion, ear pain, sinus pain and sore throat.   Eyes: Negative for blurred vision, double vision, discharge and redness.  Respiratory: Negative for cough, hemoptysis, shortness of breath and wheezing.   Cardiovascular: Negative for chest pain.  Gastrointestinal: Negative for abdominal pain, diarrhea, nausea and vomiting.  Genitourinary: Negative for dysuria, flank pain and hematuria.   Musculoskeletal: Positive for myalgias.  Skin: Negative for rash.  Neurological: Negative for dizziness, weakness and headaches.  Psychiatric/Behavioral: Negative for depression and substance abuse.     Objective:   Vitals: BP 127/81   Pulse 80   Temp 99.8 F (37.7 C) (Oral)   Resp 16   SpO2 98%   Physical Exam Constitutional:      General: He is not in acute distress.    Appearance: Normal appearance. He is well-developed. He is not ill-appearing, toxic-appearing or diaphoretic.  HENT:     Head: Normocephalic and atraumatic.     Right Ear: External ear normal.     Left Ear: External ear normal.     Nose: Nose normal.     Mouth/Throat:     Mouth: Mucous membranes are moist.     Pharynx: Oropharynx is clear.  Eyes:     General: No scleral icterus.    Extraocular Movements: Extraocular movements intact.     Pupils: Pupils are equal, round, and reactive to light.  Cardiovascular:     Rate and Rhythm: Normal rate and regular rhythm.     Heart sounds: Normal heart sounds. No murmur. No friction rub. No gallop.   Pulmonary:     Effort: Pulmonary effort is normal. No respiratory distress.     Breath sounds: Normal breath sounds. No stridor. No wheezing, rhonchi or rales.  Skin:    General: Skin is warm and dry.  Neurological:     Mental Status: He is alert and oriented to person, place, and time.  Psychiatric:  Mood and Affect: Mood normal.        Behavior: Behavior normal.        Thought Content: Thought content normal.        Judgment: Judgment normal.      Assessment and Plan :   1. Viral illness   2. Chills   3. Body aches   4. Fever, unspecified     Patient likely has COVID-19 but for now will manage for viral illness with supportive care.  COVID-19 testing is pending.  I offered patient prescriptions but he declined. Counseled patient on potential for adverse effects with medications prescribed/recommended today, ER and return-to-clinic precautions  discussed, patient verbalized understanding.    Jaynee Eagles, [place] 03/15/19 1107

## 2019-03-15 NOTE — Discharge Instructions (Signed)
Will manage for viral illness such as viral URI, viral rhinitis, possible COVID-19. Counseled patient on nature of COVID-19 including modes of transmission, diagnostic testing, management and supportive care.  Offered symptomatic relief. COVID 19 testing is pending. Counseled patient on potential for adverse effects with medications prescribed/recommended today, ER and return-to-clinic precautions discussed, patient verbalized understanding.    We will manage this as a viral syndrome. For sore throat or cough try using a honey-based tea. Use 3 teaspoons of honey with juice squeezed from half lemon. Place shaved pieces of ginger into 1/2-1 cup of water and warm over stove top. Then mix the ingredients and repeat every 4 hours as needed. Please take Tylenol 500mg  every 6 hours. Hydrate very well with at least 2 liters of water. Eat light meals such as soups to replenish electrolytes and soft fruits, veggies. Start an antihistamine like Zyrtec (cetirizine) at 10mg  daily for postnasal drainage, sinus congestion.  You can take this together with pseudoephedrine (Sudafed) at a dose of 60 mg 3 times a day or twice daily as needed for the same kind of congestion.

## 2019-03-15 NOTE — ED Triage Notes (Signed)
Father in law has [place], daughter is COVID positive as well.   Chills, fever, body aches.

## 2019-03-17 LAB — NOVEL CORONAVIRUS, NAA (HOSP ORDER, SEND-OUT TO REF LAB; TAT 18-24 HRS): SARS-CoV-2, NAA: DETECTED — AB

## 2019-03-18 ENCOUNTER — Telehealth (HOSPITAL_COMMUNITY): Payer: Self-pay | Admitting: Emergency Medicine

## 2019-03-18 NOTE — Telephone Encounter (Signed)

## 2019-03-27 ENCOUNTER — Ambulatory Visit: Payer: BC Managed Care – PPO | Attending: Internal Medicine

## 2019-03-27 ENCOUNTER — Other Ambulatory Visit: Payer: Self-pay

## 2019-03-27 DIAGNOSIS — Z20822 Contact with and (suspected) exposure to covid-19: Secondary | ICD-10-CM | POA: Diagnosis not present

## 2019-03-28 LAB — NOVEL CORONAVIRUS, NAA: SARS-CoV-2, NAA: DETECTED — AB

## 2019-03-31 ENCOUNTER — Other Ambulatory Visit: Payer: Self-pay

## 2019-03-31 ENCOUNTER — Ambulatory Visit: Payer: BC Managed Care – PPO | Attending: Internal Medicine

## 2019-03-31 DIAGNOSIS — Z20822 Contact with and (suspected) exposure to covid-19: Secondary | ICD-10-CM

## 2019-04-01 LAB — NOVEL CORONAVIRUS, NAA: SARS-CoV-2, NAA: NOT DETECTED

## 2019-04-19 ENCOUNTER — Other Ambulatory Visit: Payer: Self-pay

## 2019-04-19 ENCOUNTER — Emergency Department (HOSPITAL_COMMUNITY): Payer: BC Managed Care – PPO

## 2019-04-19 ENCOUNTER — Emergency Department (HOSPITAL_COMMUNITY)
Admission: EM | Admit: 2019-04-19 | Discharge: 2019-04-19 | Disposition: A | Discharge status: Home / Self Care | Payer: BC Managed Care – PPO | Attending: Emergency Medicine | Admitting: Emergency Medicine

## 2019-04-19 ENCOUNTER — Encounter (HOSPITAL_COMMUNITY): Payer: Self-pay | Admitting: Emergency Medicine

## 2019-04-19 DIAGNOSIS — R1030 Lower abdominal pain, unspecified: Secondary | ICD-10-CM | POA: Diagnosis not present

## 2019-04-19 DIAGNOSIS — K5792 Diverticulitis of intestine, part unspecified, without perforation or abscess without bleeding: Secondary | ICD-10-CM | POA: Diagnosis not present

## 2019-04-19 DIAGNOSIS — F17210 Nicotine dependence, cigarettes, uncomplicated: Secondary | ICD-10-CM | POA: Diagnosis not present

## 2019-04-19 DIAGNOSIS — N200 Calculus of kidney: Secondary | ICD-10-CM | POA: Insufficient documentation

## 2019-04-19 DIAGNOSIS — R109 Unspecified abdominal pain: Secondary | ICD-10-CM | POA: Diagnosis not present

## 2019-04-19 LAB — COMPREHENSIVE METABOLIC PANEL
ALT: 45 U/L — ABNORMAL HIGH (ref 0–44)
AST: 28 U/L (ref 15–41)
Albumin: 4.8 g/dL (ref 3.5–5.0)
Alkaline Phosphatase: 99 U/L (ref 38–126)
Anion gap: 12 (ref 5–15)
BUN: 10 mg/dL (ref 6–20)
CO2: 26 mmol/L (ref 22–32)
Calcium: 9.4 mg/dL (ref 8.9–10.3)
Chloride: 99 mmol/L (ref 98–111)
Creatinine, Ser: 0.95 mg/dL (ref 0.61–1.24)
GFR calc Af Amer: >60 mL/min (ref >60)
GFR calc non Af Amer: >60 mL/min (ref >60)
Glucose, Bld: 91 mg/dL (ref 70–99)
Potassium: 3.6 mmol/L (ref 3.5–5.1)
Sodium: 137 mmol/L (ref 135–145)
Total Bilirubin: 1.1 mg/dL (ref 0.3–1.2)
Total Protein: 8.2 g/dL — ABNORMAL HIGH (ref 6.5–8.1)

## 2019-04-19 LAB — URINALYSIS, ROUTINE W REFLEX MICROSCOPIC
Bilirubin Urine: NEGATIVE
Glucose, UA: NEGATIVE mg/dL
Hgb urine dipstick: NEGATIVE
Ketones, ur: NEGATIVE mg/dL
Leukocytes,Ua: NEGATIVE
Nitrite: NEGATIVE
Protein, ur: NEGATIVE mg/dL
Specific Gravity, Urine: 1.008 (ref 1.005–1.030)
pH: 7 (ref 5.0–8.0)

## 2019-04-19 LAB — CBC
HCT: 45.7 % (ref 39.0–52.0)
Hemoglobin: 15.2 g/dL (ref 13.0–17.0)
MCH: 32.1 pg (ref 26.0–34.0)
MCHC: 33.3 g/dL (ref 30.0–36.0)
MCV: 96.6 fL (ref 80.0–100.0)
Platelets: 306 10*3/uL (ref 150–400)
RBC: 4.73 MIL/uL (ref 4.22–5.81)
RDW: 12.3 % (ref 11.5–15.5)
WBC: 15.7 10*3/uL — ABNORMAL HIGH (ref 4.0–10.5)
nRBC: 0 % (ref 0.0–0.2)

## 2019-04-19 LAB — LIPASE, BLOOD: Lipase: 21 U/L (ref 11–51)

## 2019-04-19 MED ORDER — METRONIDAZOLE 500 MG PO TABS: 500.0000 mg | ORAL_TABLET | Freq: Two times a day (BID) | ORAL | 0 refills | Status: AC

## 2019-04-19 MED ORDER — HYDROCODONE-ACETAMINOPHEN 5-325 MG PO TABS: 1.0000 | ORAL_TABLET | ORAL | 0 refills | Status: DC | PRN

## 2019-04-19 MED ORDER — IOHEXOL 300 MG/ML  SOLN
100.0000 mL | Freq: Once | INTRAMUSCULAR | Status: AC | PRN
  Administered 2019-04-19: 18:00:00 100 mL via INTRAVENOUS

## 2019-04-19 MED ORDER — CIPROFLOXACIN HCL 500 MG PO TABS: 500.0000 mg | ORAL_TABLET | Freq: Two times a day (BID) | ORAL | 0 refills | Status: AC

## 2019-04-19 MED ORDER — SODIUM CHLORIDE 0.9% FLUSH: 3.0000 mL | Freq: Once | INTRAVENOUS | Status: DC

## 2019-04-19 MED ORDER — METRONIDAZOLE 500 MG PO TABS
500.0000 mg | ORAL_TABLET | Freq: Once | ORAL | Status: AC
  Administered 2019-04-19: 500 mg via ORAL
  Filled 2019-04-19: qty 1

## 2019-04-19 MED ORDER — CIPROFLOXACIN HCL 250 MG PO TABS
500.0000 mg | ORAL_TABLET | Freq: Once | ORAL | Status: AC
  Administered 2019-04-19: 500 mg via ORAL
  Filled 2019-04-19: qty 2

## 2019-04-19 MED ORDER — ONDANSETRON 4 MG PO TBDP: 4.0000 mg | ORAL_TABLET | Freq: Three times a day (TID) | ORAL | 0 refills | Status: DC | PRN

## 2019-04-19 NOTE — Discharge Instructions (Signed)
See your Physician for recheck next week.  Return if any problems.   °

## 2019-04-19 NOTE — ED Provider Notes (Signed)
[facility] Provider Note   CSN: [id number] Arrival date & time: 04/19/19  1353     History Chief Complaint  Patient presents with  . Abdominal Pain    Wayne Peterson is a 44 y.o. male.  The history is provided by the patient. No language interpreter was used.  Abdominal Pain Pain location:  Suprapubic Pain quality: aching   Pain radiates to:  Back Pain severity:  Moderate Onset quality:  Gradual Duration:  2 days Timing:  Constant Progression:  Worsening Chronicity:  New Relieved by:  Nothing Worsened by:  Nothing Associated symptoms: nausea   Risk factors: has not had multiple surgeries   Pt complains of lower abdominal pain.  Pt reports no appetite yesterday or today.Pt has had a kidney stone.  Pt reports  pain is different.  Pt reports normal bowel movement. No blood in urine.      Past Medical History:  Diagnosis Date  . History of kidney stones     There are no problems to display for this patient.   Past Surgical History:  Procedure Laterality Date  . CYSTOSCOPY WITH RETROGRADE PYELOGRAM, URETEROSCOPY AND STENT PLACEMENT  08/26/2018   Procedure: CYSTOSCOPY WITH BILATERAL RETROGRADE PYELOGRAM, LEFT URETEROSCOPY AND LEFT STENT PLACEMENT;  Surgeon: Alexis Frock, MD;  Location: [facility];  Service: Urology;;  . HOLMIUM LASER APPLICATION Left [id number]   Procedure: HOLMIUM LASER APPLICATION;  Surgeon: Alexis Frock, MD;  Location: [facility];  Service: Urology;  Laterality: Left;  . WISDOM TOOTH EXTRACTION         Family History  Problem Relation Age of Onset  . Cancer Mother   . Cirrhosis Father     Social History   Tobacco Use  . Smoking status: Current Every Day Smoker    Packs/day: 0.50    Years: 23.00    Pack years: 11.50    Types: Cigarettes  . Smokeless tobacco: Never Used  Substance Use Topics  . Alcohol use: Not Currently  . Drug use: Not Currently    Types: Marijuana   Comment: marijuana "hemp" last used yesterday    Home Medications Prior to Admission medications   Not on File    Allergies    Penicillins  Review of Systems   Review of Systems  Gastrointestinal: Positive for abdominal pain and nausea.  All other systems reviewed and are negative.   Physical Exam Updated Vital Signs BP 139/89   Pulse 95   Temp 98.8 F (37.1 C) (Oral)   Resp 11   Ht 5\' 10"  (1.778 m)   Wt 90.7 kg   SpO2 95%   BMI 28.70 kg/m   Physical Exam Vitals and nursing note reviewed.  Constitutional:      Appearance: He is well-developed.  HENT:     Head: Normocephalic and atraumatic.  Eyes:     Conjunctiva/sclera: Conjunctivae normal.  Cardiovascular:     Rate and Rhythm: Normal rate and regular rhythm.     Heart sounds: No murmur.  Pulmonary:     Effort: Pulmonary effort is normal. No respiratory distress.     Breath sounds: Normal breath sounds.  Abdominal:     General: Abdomen is flat. Bowel sounds are normal.     Palpations: Abdomen is soft.     Tenderness: There is abdominal tenderness in the suprapubic area.     Hernia: No hernia is present.  Musculoskeletal:     Cervical back: Neck supple.  Skin:  General: Skin is warm and dry.  Neurological:     General: No focal deficit present.     Mental Status: He is alert.     ED Results / Procedures / Treatments   Labs (all labs ordered are listed, but only abnormal results are displayed) Labs Reviewed  COMPREHENSIVE METABOLIC PANEL - Abnormal; Notable for the following components:      Result Value   Total Protein 8.2 (*)    ALT 45 (*)    All other components within normal limits  CBC - Abnormal; Notable for the following components:   WBC 15.7 (*)    All other components within normal limits  URINALYSIS, ROUTINE W REFLEX MICROSCOPIC - Abnormal; Notable for the following components:   Color, Urine STRAW (*)    All other components within normal limits  LIPASE, BLOOD     EKG None  Radiology CT ABDOMEN PELVIS W CONTRAST  Result Date: 04/19/2019 CLINICAL DATA:  Patient reports lower abdominal pain that started last night. Patient states the pain started more around his penis and now radiates some into his lower back. No n/v/d. No fever. EXAM: CT ABDOMEN AND PELVIS WITH CONTRAST TECHNIQUE: Multidetector CT imaging of the abdomen and pelvis was performed using the standard protocol following bolus administration of intravenous contrast. CONTRAST:  164mL OMNIPAQUE IOHEXOL 300 MG/ML  SOLN COMPARISON:  CT abdomen pelvis 06/17/2018 FINDINGS: Lower chest: No acute abnormality. Hepatobiliary: No focal liver abnormality is seen. No gallstones, gallbladder wall thickening, or biliary dilatation. Pancreas: Unremarkable. No pancreatic ductal dilatation or surrounding inflammatory changes. Spleen: Normal in size without focal abnormality. Adrenals/Urinary Tract: Adrenal glands are unremarkable. There is a nonobstructing left 4 mm calculus. No hydronephrosis. No right renal calculi. No suspicious renal lesion. Bladder is unremarkable. Stomach/Bowel: Stomach is within normal limits. Appendix appears normal. There is bowel wall thickening and pericolonic fat stranding in the sigmoid colon. There are a few scattered diverticula. No pericolonic fluid collection or free air. Vascular/Lymphatic: No significant vascular findings are present. No enlarged abdominal or pelvic lymph nodes. Reproductive: Prostate is unremarkable. Other: No abdominal wall hernia or abnormality. No abdominopelvic ascites. Musculoskeletal: No acute or significant osseous findings. IMPRESSION: 1. Sigmoid bowel wall thickening and pericolonic fat stranding favored to represent acute diverticulitis though there are few diverticula in this region. Less likely this could be an infectious or inflammatory colitis. No evidence of abscess or perforation. 2. Nonobstructing left nephrolithiasis Electronically Signed   By: Audie Pinto M.D.   On: 04/19/2019 18:05    Procedures Procedures (including critical care time)  Medications Ordered in ED Medications  sodium chloride flush (NS) 0.9 % injection 3 mL (3 mLs Intravenous Not Given 04/19/19 1624)  iohexol (OMNIPAQUE) 300 MG/ML solution 100 mL (100 mLs Intravenous Contrast Given 04/19/19 1736)    ED Course  I have reviewed the triage vital signs and the nursing notes.  Pertinent labs & imaging results that were available during my care of the patient were reviewed by me and considered in my medical decision making (see chart for details).    MDM Rules/Calculators/A&P                      MDM:  Ct scan shows diverticulitis.  Pt given rx for flagyl and cipro.  Pt counseled on diverticulits and follow up  Final Clinical Impression(s) / ED Diagnoses Final diagnoses:  Diverticulitis    Rx / DC Orders ED Discharge Orders  Ordered    metroNIDAZOLE (FLAGYL) 500 MG tablet  2 times daily     04/19/19 1846    ciprofloxacin (CIPRO) 500 MG tablet  2 times daily     04/19/19 1846    HYDROcodone-acetaminophen (NORCO/VICODIN) 5-325 MG tablet  Every 4 hours PRN,   Status:  Discontinued     04/19/19 1846    ondansetron (ZOFRAN ODT) 4 MG disintegrating tablet  Every 8 hours PRN     04/19/19 1846    HYDROcodone-acetaminophen (NORCO/VICODIN) 5-325 MG tablet  Every 4 hours PRN     04/19/19 1848        An After Visit Summary was printed and given to the patient.    Sidney Ace 04/19/19 2347    Nat Christen, MD 04/20/19 Lurline Hare

## 2019-04-19 NOTE — ED Triage Notes (Signed)
Patient reports lower abdominal pain that started last night. Patient states the pain started more around his penis and now radiates some into his lower back. No n/v/d. No fever.

## 2019-04-20 ENCOUNTER — Telehealth: Payer: Self-pay

## 2019-04-20 ENCOUNTER — Telehealth (HOSPITAL_COMMUNITY): Payer: Self-pay | Admitting: Emergency Medicine

## 2019-04-20 MED ORDER — HYDROCODONE-ACETAMINOPHEN 5-325 MG PO TABS: 1.0000 | ORAL_TABLET | ORAL | 0 refills | Status: DC | PRN

## 2019-04-20 NOTE — Telephone Encounter (Signed)
Ok to schedule OV?  °

## 2019-04-20 NOTE — ED Provider Notes (Signed)
11:45 AM-the patient contacted ER nursing staff, stating that he would not be picking up his prescription from [facility] in [place], because his insurance is changed.  I was able to contact them and discontinue the prescription.  The patient wanted his Vicodin sent to a local pharmacy.  I was able to send a prescription to the [facility].     Daleen Bo, MD 04/20/19 [id number]

## 2019-04-20 NOTE — Telephone Encounter (Signed)
Pt is aware of OV on Monday

## 2019-04-20 NOTE — Telephone Encounter (Signed)
Pt seen in ER. Can we accept him as a new patient?

## 2019-04-23 NOTE — Progress Notes (Signed)
Referring Provider: [facility]  Primary Care Physician:  Patient, No Pcp Per Primary Gastroenterologist:  Dr. Gala Romney  Chief Complaint  Patient presents with  . Diverticulitis    FU [facility] ER,lower abd pain,diarrhea,currently on antibiotics    HPI:   KAYVAN LASKEY II is a 44 y.o. male presenting today at the request of [facility] emergency department for abdominal pain.  Patient was seen in the ED on 04/19/2019 for lower abdominal pain radiating to his back that had been worsening over 2 days.  CT abdomen and pelvis revealed sigmoid bowel wall thickening and pericolonic fat stranding favored to represent acute diverticulitis though there are a few diverticula in this region.  Less likely this could be an infectious or inflammatory colitis.  No evidence of abscess or perforation.  Also with nonobstructing left nephrolithiasis.  CBC remarkable for WBC 15.7.  CMP without any significant abnormalities.  Lipase normal.  He was prescribed metronidazole 500 mg twice daily and ciprofloxacin 500 mg twice daily x 10 days.    Today: Overall, he is feeling improved.  Symptoms at the time of onset included lower abdominal pain, lower back pain, and diarrhea.  He has been taking his antibiotics and follow day liquid diet until Saturday afternoon. Started with soft foods. Sunday started eating more of a normal diet. Lower abdominal pain has resolved. Mild dullness in left lower back. Has continued with BMs over the weekend but consistency is improving.  More of a pudding/mushy consistency. Not having frequent watery stools. No nocturnal stools. No nausea or vomiting. No upper GI trouble. No blood in the stool. No black stools. No prior episode of diverticulitis. No prior coloscopy.   Hemp > Marijuana.   Past Medical History:  Diagnosis Date  . History of kidney stones     Past Surgical History:  Procedure Laterality Date  . CYSTOSCOPY WITH RETROGRADE PYELOGRAM, URETEROSCOPY AND STENT PLACEMENT   08/26/2018   Procedure: CYSTOSCOPY WITH BILATERAL RETROGRADE PYELOGRAM, LEFT URETEROSCOPY AND LEFT STENT PLACEMENT;  Surgeon: Alexis Frock, MD;  Location: [facility];  Service: Urology;;  . HOLMIUM LASER APPLICATION Left [id number]   Procedure: HOLMIUM LASER APPLICATION;  Surgeon: Alexis Frock, MD;  Location: [facility];  Service: Urology;  Laterality: Left;  . WISDOM TOOTH EXTRACTION      Current Outpatient Medications  Medication Sig Dispense Refill  . ciprofloxacin (CIPRO) 500 MG tablet Take 1 tablet (500 mg total) by mouth 2 (two) times daily for 10 days. 20 tablet 0  . metroNIDAZOLE (FLAGYL) 500 MG tablet Take 1 tablet (500 mg total) by mouth 2 (two) times daily for 7 days. 20 tablet 0  . metroNIDAZOLE (FLAGYL) 500 MG tablet Take 1 tablet (500 mg total) by mouth 3 (three) times daily. 5 tablet 0   No current facility-administered medications for this visit.    Allergies as of 04/24/2019 - Review Complete 04/24/2019  Allergen Reaction Noted  . Penicillins Nausea And Vomiting 06/17/2018    Family History  Problem Relation Age of Onset  . Cancer Mother   . Cirrhosis Father   . Colon cancer Neg Hx     Social History   Socioeconomic History  . Marital status: Married    Spouse name: Not on file  . Number of children: Not on file  . Years of education: Not on file  . Highest education level: Not on file  Occupational History  . Not on file  Tobacco Use  . Smoking status:  Current Every Day Smoker    Packs/day: 0.50    Years: 23.00    Pack years: 11.50    Types: Cigarettes  . Smokeless tobacco: Never Used  Substance and Sexual Activity  . Alcohol use: Not Currently  . Drug use: Yes    Types: Marijuana    Comment: marijuana "hemp" over the weekend  . Sexual activity: Not Currently  Other Topics Concern  . Not on file  Social History Narrative  . Not on file   Social Determinants of Health   Financial Resource Strain:   .  Difficulty of Paying Living Expenses: Not on file  Food Insecurity:   . Worried About Charity fundraiser in the Last Year: Not on file  . Ran Out of Food in the Last Year: Not on file  Transportation Needs:   . Lack of Transportation (Medical): Not on file  . Lack of Transportation (Non-Medical): Not on file  Physical Activity:   . Days of Exercise per Week: Not on file  . Minutes of Exercise per Session: Not on file  Stress:   . Feeling of Stress : Not on file  Social Connections:   . Frequency of Communication with Friends and Family: Not on file  . Frequency of Social Gatherings with Friends and Family: Not on file  . Attends Religious Services: Not on file  . Active Member of Clubs or Organizations: Not on file  . Attends Archivist Meetings: Not on file  . Marital Status: Not on file  Intimate Partner Violence:   . Fear of Current or Ex-Partner: Not on file  . Emotionally Abused: Not on file  . Physically Abused: Not on file  . Sexually Abused: Not on file    Review of Systems: Gen: Denies any fever, chills, lightheadedness, dizziness, pre-syncope or syncope.  CV: Denies chest pain or heart palpitations.  Resp: Denies shortness of breath or cough. GI: See HPI GU : Denies urinary burning, urinary frequency, urinary hesitancy MS: Denies joint pain.  Derm: Denies rash. Psych: Denies depression or anxiety Heme: Denies bruising or bleeding  Physical Exam: BP 126/72   Pulse 76   Temp (!) 96.9 F (36.1 C) (Temporal)   Ht 5\' 10"  (1.778 m)   Wt 201 lb (91.2 kg)   BMI 28.84 kg/m  General:   Alert and oriented. Pleasant and cooperative. Well-nourished and well-developed.  Head:  Normocephalic and atraumatic. Eyes:  Without icterus, sclera clear and conjunctiva pink.  Ears:  Normal auditory acuity. Lungs:  Clear to auscultation bilaterally. No wheezes, rales, or rhonchi. No distress.  Heart:  S1, S2 present without murmurs appreciated.  Abdomen:  +BS, soft, and  non-distended.  Very mild tenderness to palpation in the right lower quadrant.  Patient states it is more of a dull pain.  No HSM noted. No guarding or rebound. No masses appreciated.  Rectal:  Deferred  Msk:  Symmetrical without gross deformities. Normal posture. Extremities:  Without edema. Neurologic:  Alert and  oriented x4;  grossly normal neurologically. Skin:  Intact without significant lesions or rashes. Psych:  Normal mood and affect.

## 2019-04-24 ENCOUNTER — Ambulatory Visit: Payer: BC Managed Care – PPO | Admitting: Gastroenterology

## 2019-04-24 ENCOUNTER — Encounter: Payer: Self-pay | Admitting: Gastroenterology

## 2019-04-24 ENCOUNTER — Other Ambulatory Visit: Payer: Self-pay

## 2019-04-24 VITALS — BP 126/72 | HR 76 | Temp 96.9°F | Ht 70.0 in | Wt 201.0 lb

## 2019-04-24 DIAGNOSIS — K5732 Diverticulitis of large intestine without perforation or abscess without bleeding: Secondary | ICD-10-CM | POA: Diagnosis not present

## 2019-04-24 MED ORDER — METRONIDAZOLE 500 MG PO TABS: 500.0000 mg | ORAL_TABLET | Freq: Three times a day (TID) | ORAL | 0 refills | Status: DC

## 2019-04-24 NOTE — Patient Instructions (Addendum)
Please start taking metronidazole 500 mg 3 times daily to complete a total of 10 days.  I have sent in the additional tablets you will need.  Continue ciprofloxacin 500 mg twice daily to complete 10 days.  Continue following a low fiber diet for now.  After complete resolution of your symptoms and completion of antibiotics, you can resume a high-fiber diet.  We will get you scheduled for colonoscopy in the near future with Dr. Gala Romney.  Should she have return of your abdominal pain, you should let us know.  If your colon is acutely inflamed at the time of colonoscopy, there is increased risk of perforation.  We will follow up with you as recommended at the time of your colonoscopy.  Call if you have questions or concerns.  Aliene Altes, PA-C [facility]

## 2019-04-24 NOTE — Assessment & Plan Note (Addendum)
44 year old male recently diagnosed with acute uncomplicated diverticulitis of the sigmoid colon on 04/19/2019.  Symptoms at that time included lower abdominal pain, back pain, and diarrhea. He was started on metronidazole 500 mg twice daily and ciprofloxacin 500 mg twice daily x10 days.  He is on day 5 of antibiotics and symptoms have improved significantly.  Lower abdominal pain has resolved.  Stools are mushy at this time.  Only with dull lower back pain.  Denies bright red blood per rectum or melena.  No prior colonoscopy.  He was advised to continue following a low fiber diet for now.  May return to high-fiber diet after complete resolution of symptoms and completion of antibiotics. I am sending in 5 additional metronidazole tablets for patient to increase metronidazole to 3 times daily to complete his 10-day regimen as this is the recommended treatment. He will continue ciprofloxacin 500 mg twice daily to complete 10-day course.  We will get him scheduled for a colonoscopy in 6-8 weeks or thereafter. The risks, benefits, and alternatives have been discussed in detail with patient. They have stated understanding and desire to proceed.  He was advised to let us know if his abdominal pain returns prior to his colonoscopy.  Explained increased risk of perforation if colon is acutely inflamed.  Patient voiced his understanding. Follow-up as recommended at the time of colonoscopy.

## 2019-04-25 ENCOUNTER — Encounter: Payer: Self-pay | Admitting: *Deleted

## 2019-04-25 ENCOUNTER — Other Ambulatory Visit: Payer: Self-pay | Admitting: *Deleted

## 2019-04-25 MED ORDER — PEG 3350-KCL-NA BICARB-NACL 420 G PO SOLR: 4000.0000 mL | Freq: Once | ORAL | 0 refills | Status: AC

## 2019-06-08 ENCOUNTER — Telehealth: Payer: Self-pay | Admitting: Internal Medicine

## 2019-06-08 NOTE — Telephone Encounter (Signed)
Called pt, he has access to his MyChart. Miralax instructions given.

## 2019-06-08 NOTE — Telephone Encounter (Signed)
[facility] called to say prep was on back order and his procedure is next Friday with RMR. Please advise. [phone]

## 2019-06-14 NOTE — Telephone Encounter (Signed)
Pt called to get instructions for the Miralax prep prior to his procedure. Discussed prep instructions with pt.

## 2019-06-15 ENCOUNTER — Other Ambulatory Visit: Payer: Self-pay

## 2019-06-15 ENCOUNTER — Other Ambulatory Visit (HOSPITAL_COMMUNITY): Payer: BC Managed Care – PPO

## 2019-06-15 ENCOUNTER — Other Ambulatory Visit (HOSPITAL_COMMUNITY)
Admission: RE | Admit: 2019-06-15 | Discharge: 2019-06-15 | Disposition: A | Discharge status: Home / Self Care | Payer: BC Managed Care – PPO | Source: Ambulatory Visit | Attending: Internal Medicine | Admitting: Internal Medicine

## 2019-06-15 DIAGNOSIS — Z20822 Contact with and (suspected) exposure to covid-19: Secondary | ICD-10-CM | POA: Diagnosis not present

## 2019-06-16 ENCOUNTER — Encounter (HOSPITAL_COMMUNITY)
Admission: RE | Disposition: A | Discharge status: Home / Self Care | Payer: Self-pay | Source: Home / Self Care | Attending: Internal Medicine

## 2019-06-16 ENCOUNTER — Ambulatory Visit (HOSPITAL_COMMUNITY)
Admission: RE | Admit: 2019-06-16 | Discharge: 2019-06-16 | Disposition: A | Discharge status: Home / Self Care | Payer: BC Managed Care – PPO | Attending: Internal Medicine | Admitting: Internal Medicine

## 2019-06-16 ENCOUNTER — Encounter (HOSPITAL_COMMUNITY): Payer: Self-pay | Admitting: Internal Medicine

## 2019-06-16 ENCOUNTER — Other Ambulatory Visit: Payer: Self-pay

## 2019-06-16 DIAGNOSIS — D124 Benign neoplasm of descending colon: Secondary | ICD-10-CM | POA: Diagnosis not present

## 2019-06-16 DIAGNOSIS — K621 Rectal polyp: Secondary | ICD-10-CM | POA: Diagnosis not present

## 2019-06-16 DIAGNOSIS — K5790 Diverticulosis of intestine, part unspecified, without perforation or abscess without bleeding: Secondary | ICD-10-CM | POA: Insufficient documentation

## 2019-06-16 DIAGNOSIS — R933 Abnormal findings on diagnostic imaging of other parts of digestive tract: Secondary | ICD-10-CM | POA: Diagnosis not present

## 2019-06-16 DIAGNOSIS — F17210 Nicotine dependence, cigarettes, uncomplicated: Secondary | ICD-10-CM | POA: Diagnosis not present

## 2019-06-16 DIAGNOSIS — K5900 Constipation, unspecified: Secondary | ICD-10-CM | POA: Diagnosis not present

## 2019-06-16 DIAGNOSIS — K635 Polyp of colon: Secondary | ICD-10-CM

## 2019-06-16 DIAGNOSIS — D122 Benign neoplasm of ascending colon: Secondary | ICD-10-CM | POA: Insufficient documentation

## 2019-06-16 DIAGNOSIS — D125 Benign neoplasm of sigmoid colon: Secondary | ICD-10-CM | POA: Diagnosis not present

## 2019-06-16 HISTORY — DX: Polyp of colon: K63.5

## 2019-06-16 HISTORY — PX: COLONOSCOPY: SHX5424

## 2019-06-16 LAB — SARS CORONAVIRUS 2 (TAT 6-24 HRS): SARS Coronavirus 2: NEGATIVE

## 2019-06-16 SURGERY — COLONOSCOPY
Anesthesia: Moderate Sedation

## 2019-06-16 MED ORDER — SPOT INK MARKER SYRINGE KIT
PACK | SUBMUCOSAL | Status: DC | PRN
  Administered 2019-06-16: 1 mL via SUBMUCOSAL

## 2019-06-16 MED ORDER — MEPERIDINE HCL 100 MG/ML IJ SOLN
INTRAMUSCULAR | Status: DC | PRN
  Administered 2019-06-16 (×2): 25 mg

## 2019-06-16 MED ORDER — MIDAZOLAM HCL 5 MG/5ML IJ SOLN
INTRAMUSCULAR | Status: DC | PRN
  Administered 2019-06-16 (×2): 1 mg via INTRAVENOUS
  Administered 2019-06-16: 2 mg via INTRAVENOUS
  Administered 2019-06-16 (×3): 1 mg via INTRAVENOUS
  Administered 2019-06-16 (×2): 2 mg via INTRAVENOUS

## 2019-06-16 MED ORDER — SODIUM CHLORIDE 0.9 % IV SOLN: INTRAVENOUS | Status: DC

## 2019-06-16 MED ORDER — ONDANSETRON HCL 4 MG/2ML IJ SOLN
INTRAMUSCULAR | Status: DC | PRN
  Administered 2019-06-16: 4 mg via INTRAVENOUS

## 2019-06-16 MED ORDER — MEPERIDINE HCL 50 MG/ML IJ SOLN
INTRAMUSCULAR | Status: AC
  Filled 2019-06-16: qty 1

## 2019-06-16 MED ORDER — MIDAZOLAM HCL 5 MG/5ML IJ SOLN
INTRAMUSCULAR | Status: AC
  Filled 2019-06-16: qty 5

## 2019-06-16 MED ORDER — STERILE WATER FOR IRRIGATION IR SOLN: Status: DC | PRN

## 2019-06-16 MED ORDER — ONDANSETRON HCL 4 MG/2ML IJ SOLN
INTRAMUSCULAR | Status: AC
  Filled 2019-06-16: qty 2

## 2019-06-16 MED ORDER — SPOT INK MARKER SYRINGE KIT
PACK | SUBMUCOSAL | Status: AC
  Filled 2019-06-16: qty 5

## 2019-06-16 MED ORDER — MIDAZOLAM HCL 5 MG/5ML IJ SOLN
INTRAMUSCULAR | Status: AC
  Filled 2019-06-16: qty 10

## 2019-06-16 NOTE — H&P (Signed)
@LOGO @   Primary Care Physician:  Patient, No Pcp Per Primary Gastroenterologist:  Dr. Gala Romney  Pre-Procedure History & Physical: HPI:  Wayne Peterson is a 44 y.o. male here for further evaluation of abnormal colon on CT.  Treated empirically for diverticulitis.  Symptoms have improved.  He is baseline constipation taking MiraLAX every day for 1-3 bowel movements daily.  Past Medical History:  Diagnosis Date  . History of kidney stones     Past Surgical History:  Procedure Laterality Date  . CYSTOSCOPY WITH RETROGRADE PYELOGRAM, URETEROSCOPY AND STENT PLACEMENT  08/26/2018   Procedure: CYSTOSCOPY WITH BILATERAL RETROGRADE PYELOGRAM, LEFT URETEROSCOPY AND LEFT STENT PLACEMENT;  Surgeon: Alexis Frock, MD;  Location: [facility];  Service: Urology;;  . HOLMIUM LASER APPLICATION Left [id number]   Procedure: HOLMIUM LASER APPLICATION;  Surgeon: Alexis Frock, MD;  Location: [facility];  Service: Urology;  Laterality: Left;  . WISDOM TOOTH EXTRACTION      Prior to Admission medications   Medication Sig Start Date End Date Taking? Authorizing Provider  Multiple Vitamin (MULTIVITAMIN WITH MINERALS) TABS tablet Take 1 tablet by mouth daily.   Yes [provider]  polyethylene glycol (MIRALAX / GLYCOLAX) 17 g packet Take 17 g by mouth daily.   Yes [provider]  Probiotic CAPS Take 1 capsule by mouth daily.   Yes [provider]  metroNIDAZOLE (FLAGYL) 500 MG tablet Take 1 tablet (500 mg total) by mouth 3 (three) times daily. Patient not taking: Reported on 06/08/2019 04/24/19   Erenest Rasher, PA-C    Allergies as of 04/25/2019 - Review Complete 04/24/2019  Allergen Reaction Noted  . Penicillins Nausea And Vomiting 06/17/2018    Family History  Problem Relation Age of Onset  . Cancer Mother   . Cirrhosis Father   . Colon cancer Neg Hx     Social History   Socioeconomic History  . Marital status: Married    Spouse  name: Not on file  . Number of children: Not on file  . Years of education: Not on file  . Highest education level: Not on file  Occupational History  . Not on file  Tobacco Use  . Smoking status: Current Every Day Smoker    Packs/day: 0.50    Years: 23.00    Pack years: 11.50    Types: Cigarettes  . Smokeless tobacco: Never Used  Substance and Sexual Activity  . Alcohol use: Not Currently  . Drug use: Yes    Types: Marijuana    Comment: marijuana "hemp" over the weekend  . Sexual activity: Not Currently  Other Topics Concern  . Not on file  Social History Narrative  . Not on file   Social Determinants of Health   Financial Resource Strain:   . Difficulty of Paying Living Expenses:   Food Insecurity:   . Worried About Charity fundraiser in the Last Year:   . Arboriculturist in the Last Year:   Transportation Needs:   . Film/video editor (Medical):   Marland Kitchen Lack of Transportation (Non-Medical):   Physical Activity:   . Days of Exercise per Week:   . Minutes of Exercise per Session:   Stress:   . Feeling of Stress :   Social Connections:   . Frequency of Communication with Friends and Family:   . Frequency of Social Gatherings with Friends and Family:   . Attends Religious Services:   . Active Member of Clubs  or Organizations:   . Attends Archivist Meetings:   Marland Kitchen Marital Status:   Intimate Partner Violence:   . Fear of Current or Ex-Partner:   . Emotionally Abused:   Marland Kitchen Physically Abused:   . Sexually Abused:     Review of Systems: See HPI, otherwise negative ROS  Physical Exam: BP 135/78   Pulse 92   Temp 98.9 F (37.2 C) (Oral)   Resp 17   Ht 5\' 10"  (1.778 m)   SpO2 97%   BMI 28.84 kg/m  General:   Alert,  Well-developed, well-nourished, pleasant and cooperative in NAD Neck:  Supple; no masses or thyromegaly. No significant cervical adenopathy. Lungs:  Clear throughout to auscultation.   No wheezes, crackles, or rhonchi. No acute  distress. Heart:  Regular rate and rhythm; no murmurs, clicks, rubs,  or gallops. Abdomen: Non-distended, normal bowel sounds.  Soft and nontender without appreciable mass or hepatosplenomegaly.  Pulses:  Normal pulses noted. Extremities:  Without clubbing or edema.  Impression/Plan: 44 year old gentleman with abnormal colon on CT recently.  Treated empirically for diverticulitis.  Clinically, he has improved.  Colonoscopy now being done to further evaluate and noted abnormality.  The risks, benefits, limitations, alternatives and imponderables have been reviewed with the patient. Questions have been answered. All parties are agreeable.      Notice: This dictation was prepared with Dragon dictation along with smaller phrase technology. Any transcriptional errors that result from this process are unintentional and may not be corrected upon review.

## 2019-06-16 NOTE — Discharge Instructions (Addendum)
Colonoscopy Discharge Instructions  Read the instructions outlined below and refer to this sheet in the next few weeks. These discharge instructions provide you with general information on caring for yourself after you leave the hospital. Your doctor may also give you specific instructions. While your treatment has been planned according to the most current medical practices available, unavoidable complications occasionally occur. If you have any problems or questions after discharge, call Dr. Gala Romney at [phone]. ACTIVITY  You may resume your regular activity, but move at a slower pace for the next 24 hours.   Take frequent rest periods for the next 24 hours.   Walking will help get rid of the air and reduce the bloated feeling in your belly (abdomen).   No driving for 24 hours (because of the medicine (anesthesia) used during the test).    Do not sign any important legal documents or operate any machinery for 24 hours (because of the anesthesia used during the test).  NUTRITION  Drink plenty of fluids.   You may resume your normal diet as instructed by your doctor.   Begin with a light meal and progress to your normal diet. Heavy or fried foods are harder to digest and may make you feel sick to your stomach (nauseated).   Avoid alcoholic beverages for 24 hours.  MEDICATIONS  You may resume your normal medications unless your doctor tells you otherwise.  WHAT YOU CAN EXPECT TODAY  Some feelings of bloating in the abdomen.   Passage of more gas than usual.   Spotting of blood in your stool or on the toilet paper.  IF YOU HAD POLYPS REMOVED DURING THE COLONOSCOPY:  No aspirin products for 7 days or as instructed.   No alcohol for 7 days or as instructed.   Eat a soft diet for the next 24 hours.  FINDING OUT THE RESULTS OF YOUR TEST Not all test results are available during your visit. If your test results are not back during the visit, make an appointment with your caregiver  to find out the results. Do not assume everything is normal if you have not heard from your caregiver or the medical facility. It is important for you to follow up on all of your test results.  SEEK IMMEDIATE MEDICAL ATTENTION IF:  You have more than a spotting of blood in your stool.   Your belly is swollen (abdominal distention).   You are nauseated or vomiting.   You have a temperature over 101.   You have abdominal pain or discomfort that is severe or gets worse throughout the day.   Colon polyp and diverticulosis information provided  Multiple polyps removed from your colon.  2 were large.  Further recommendations to follow pending review of pathology report next week  No MRI until clips gone  May continue to use MiraLAX capful daily as needed for constipation  Begin Benefiber 1 tablespoon daily for 3 weeks; then increase to 2 tablespoons daily thereafter.  Take every day.  Office visit with [place] in 6 weeks.  Dr Roseanne Kaufman office will call with date & time  At patient request, I spoke to [place] at [phone] regarding results   Colon Polyps  Polyps are tissue growths inside the body. Polyps can grow in many places, including the large intestine (colon). A polyp may be a round bump or a mushroom-shaped growth. You could have one polyp or several. Most colon polyps are noncancerous (benign). However, some colon polyps can become cancerous over time. Finding  and removing the polyps early can help prevent this. What are the causes? The exact cause of colon polyps is not known. What increases the risk? You are more likely to develop this condition if you:  Have a family history of colon cancer or colon polyps.  Are older than 41 or older than 45 if you are African American.  Have inflammatory bowel disease, such as ulcerative colitis or Crohn's disease.  Have certain hereditary conditions, such as: ? Familial adenomatous polyposis. ? Lynch syndrome. ? Turcot  syndrome. ? Peutz-Jeghers syndrome.  Are overweight.  Smoke cigarettes.  Do not get enough exercise.  Drink too much alcohol.  Eat a diet that is high in fat and red meat and low in fiber.  Had childhood cancer that was treated with abdominal radiation. What are the signs or symptoms? Most polyps do not cause symptoms. If you have symptoms, they may include:  Blood coming from your rectum when having a bowel movement.  Blood in your stool. The stool may look dark red or black.  Abdominal pain.  A change in bowel habits, such as constipation or diarrhea. How is this diagnosed? This condition is diagnosed with a colonoscopy. This is a procedure in which a lighted, flexible scope is inserted into the anus and then passed into the colon to examine the area. Polyps are sometimes found when a colonoscopy is done as part of routine cancer screening tests. How is this treated? Treatment for this condition involves removing any polyps that are found. Most polyps can be removed during a colonoscopy. Those polyps will then be tested for cancer. Additional treatment may be needed depending on the results of testing. Follow these instructions at home: Lifestyle  Maintain a healthy weight, or lose weight if recommended by your health care provider.  Exercise every day or as told by your health care provider.  Do not use any products that contain nicotine or tobacco, such as cigarettes and e-cigarettes. If you need help quitting, ask your health care provider.  If you drink alcohol, limit how much you have: ? 0-1 drink a day for women. ? 0-2 drinks a day for men.  Be aware of how much alcohol is in your drink. In the [place] one drink equals one 12 oz bottle of beer (355 mL), one 5 oz glass of wine (148 mL), or one 1 oz shot of hard liquor (44 mL). Eating and drinking   Eat foods that are high in fiber, such as fruits, vegetables, and whole grains.  Eat foods that are high in calcium  and vitamin D, such as milk, cheese, yogurt, eggs, liver, fish, and broccoli.  Limit foods that are high in fat, such as fried foods and desserts.  Limit the amount of red meat and processed meat you eat, such as hot dogs, sausage, bacon, and lunch meats. General instructions  Keep all follow-up visits as told by your health care provider. This is important. ? This includes having regularly scheduled colonoscopies. ? Talk to your health care provider about when you need a colonoscopy. Contact a health care provider if:  You have new or worsening bleeding during a bowel movement.  You have new or increased blood in your stool.  You have a change in bowel habits.  You lose weight for no known reason. Summary  Polyps are tissue growths inside the body. Polyps can grow in many places, including the colon.  Most colon polyps are noncancerous (benign), but some can become cancerous  over time.  This condition is diagnosed with a colonoscopy.  Treatment for this condition involves removing any polyps that are found. Most polyps can be removed during a colonoscopy. This information is not intended to replace advice given to you by your health care provider. Make sure you discuss any questions you have with your health care provider. Document Revised: 06/10/2017 Document Reviewed: 06/10/2017 Elsevier Patient Education  [place].   Diverticulosis  Diverticulosis is a condition that develops when small pouches (diverticula) form in the wall of the large intestine (colon). The colon is where water is absorbed and stool (feces) is formed. The pouches form when the inside layer of the colon pushes through weak spots in the outer layers of the colon. You may have a few pouches or many of them. The pouches usually do not cause problems unless they become inflamed or infected. When this happens, the condition is called diverticulitis. What are the causes? The cause of this condition is  not known. What increases the risk? The following factors may make you more likely to develop this condition:  Being older than age 77. Your risk for this condition increases with age. Diverticulosis is rare among people younger than age 76. By age 6, many people have it.  Eating a low-fiber diet.  Having frequent constipation.  Being overweight.  Not getting enough exercise.  Smoking.  Taking over-the-counter pain medicines, like aspirin and ibuprofen.  Having a family history of diverticulosis. What are the signs or symptoms? In most people, there are no symptoms of this condition. If you do have symptoms, they may include:  Bloating.  Cramps in the abdomen.  Constipation or diarrhea.  Pain in the lower left side of the abdomen. How is this diagnosed? Because diverticulosis usually has no symptoms, it is most often diagnosed during an exam for other colon problems. The condition may be diagnosed by:  Using a flexible scope to examine the colon (colonoscopy).  Taking an X-ray of the colon after dye has been put into the colon (barium enema).  Having a CT scan. How is this treated? You may not need treatment for this condition. Your health care provider may recommend treatment to prevent problems. You may need treatment if you have symptoms or if you previously had diverticulitis. Treatment may include:  Eating a high-fiber diet.  Taking a fiber supplement.  Taking a live bacteria supplement (probiotic).  Taking medicine to relax your colon. Follow these instructions at home: Medicines  Take over-the-counter and prescription medicines only as told by your health care provider.  If told by your health care provider, take a fiber supplement or probiotic. Constipation prevention Your condition may cause constipation. To prevent or treat constipation, you may need to:  Drink enough fluid to keep your urine pale yellow.  Take over-the-counter or prescription  medicines.  Eat foods that are high in fiber, such as beans, whole grains, and fresh fruits and vegetables.  Limit foods that are high in fat and processed sugars, such as fried or sweet foods.  General instructions  Try not to strain when you have a bowel movement.  Keep all follow-up visits as told by your health care provider. This is important. Contact a health care provider if you:  Have pain in your abdomen.  Have bloating.  Have cramps.  Have not had a bowel movement in 3 days. Get help right away if:  Your pain gets worse.  Your bloating becomes very bad.  You have  a fever or chills, and your symptoms suddenly get worse.  You vomit.  You have bowel movements that are bloody or black.  You have bleeding from your rectum. Summary  Diverticulosis is a condition that develops when small pouches (diverticula) form in the wall of the large intestine (colon).  You may have a few pouches or many of them.  This condition is most often diagnosed during an exam for other colon problems.  Treatment may include increasing the fiber in your diet, taking supplements, or taking medicines. This information is not intended to replace advice given to you by your health care provider. Make sure you discuss any questions you have with your health care provider. Document Revised: 09/22/2018 Document Reviewed: 09/22/2018 Elsevier Patient Education  [place].

## 2019-06-16 NOTE — Op Note (Addendum)
[facility] Patient Name: Wayne Peterson Procedure Date: 06/16/2019 1:38 PM MRN: [id number] Date of Birth: Aug 23, 1975 Attending MD: Norvel Richards , MD CSN: [id number] Age: 44 Admit Type: Outpatient Procedure:                Colonoscopy Indications:              Abnormal CT of the GI tract Providers:                Norvel Richards, MD, Janeece Riggers, RN, Randa Spike, Technician Referring MD:              Medicines:                Midazolam 11 mg IV, Meperidine 50 mg IV,                            Ondansetron 4 mg IV Complications:            No immediate complications. Estimated Blood Loss:     Estimated blood loss was minimal. Procedure:                Pre-Anesthesia Assessment:                           - Prior to the procedure, a History and Physical                            was performed, and patient medications and                            allergies were reviewed. The patient's tolerance of                            previous anesthesia was also reviewed. The risks                            and benefits of the procedure and the sedation                            options and risks were discussed with the patient.                            All questions were answered, and informed consent                            was obtained. Prior Anticoagulants: The patient has                            taken no previous anticoagulant or antiplatelet                            agents. ASA Grade Assessment: II - A patient with  mild systemic disease. After reviewing the risks                            and benefits, the patient was deemed in                            satisfactory condition to undergo the procedure.                           After obtaining informed consent, the colonoscope                            was passed under direct vision. Throughout the                            procedure, the patient's  blood pressure, pulse, and                            oxygen saturations were monitored continuously. The                            [id number] scope was introduced through                            the anus and advanced to the 5 cm into the ileum.                            The colonoscopy was performed without difficulty.                            The patient tolerated the procedure well. The                            quality of the bowel preparation was adequate. The                            terminal ileum, ileocecal valve, appendiceal                            orifice, and rectum were photographed. Scope In: 2:06:00 PM Scope Out: 2:48:37 PM Scope Withdrawal Time: 0 hours 38 minutes 28 seconds  Total Procedure Duration: 0 hours 42 minutes 37 seconds  Findings:      The perianal and digital rectal examinations were normal.      A few small-mouthed diverticula were found in the sigmoid colon and       descending colon.      Nine sessile polyps were found in the rectum, sigmoid colon, descending       colon and ascending colon. The polyps were 5 to 9 mm in size. These       polyps were removed with a cold snare. Resection and retrieval were       complete. Estimated blood loss was minimal.      Two pedunculated polyps were found in the sigmoid colon and ascending       colon. The  polyps were 18 to 22 mm in size. These polyps were removed       with a hot snare. Resection and retrieval were complete. Estimated blood       loss was minimal. Impression:               - Diverticulosis in the sigmoid colon and in the                            descending colon (relatively minimal)                           - Nine 5 to 9 mm polyps in the rectum, in the                            sigmoid colon, in the descending colon and in the                            ascending colon, removed with a cold snare.                            Resected and retrieved.                           -  (2) 18 to 22 mm polyps in the sigmoid colon and                            in the ascending colon, removed with a hot snare.                            Resected and retrieved. Clips placed. Largest                            ascending colon polyp was tattooed 1 cm distal to                            polypectomy site Moderate Sedation:      Moderate (conscious) sedation was administered by the endoscopy nurse       and supervised by the endoscopist. The following parameters were       monitored: oxygen saturation, heart rate, blood pressure, respiratory       rate, EKG, adequacy of pulmonary ventilation, and response to care. Recommendation:           - Repeat colonoscopy in 6 months for surveillance                            after piecemeal polypectomy. Continue MiraLAX 17 g                            orally daily as needed constipation. Begin                            Benefiber 1 tablespoon daily; then increase to 2  tablespoons daily daily thereafter Procedure Code(s):        --- Professional ---                           [phone], Colonoscopy, flexible; with removal of                            tumor(s), polyp(s), or other lesion(s) by snare                            technique Diagnosis Code(s):        --- Professional ---                           K62.1, Rectal polyp                           K63.5, Polyp of colon                           K57.30, Diverticulosis of large intestine without                            perforation or abscess without bleeding                           R93.3, Abnormal findings on diagnostic imaging of                            other parts of digestive tract CPT copyright 2019 American Medical Association. All rights reserved. The codes documented in this report are preliminary and upon coder review may  be revised to meet current compliance requirements. Wayne Peterson. Wayne Yiu, MD Norvel Richards, MD 06/16/2019 3:26:51 PM This  report has been signed electronically. Number of Addenda: 0

## 2019-06-20 ENCOUNTER — Encounter: Payer: Self-pay | Admitting: Internal Medicine

## 2019-06-20 LAB — SURGICAL PATHOLOGY

## 2019-07-27 NOTE — Progress Notes (Signed)
Primary Care Physician:  Patient, No Pcp Per  Primary GI: Dr. Gala Romney   Patient Location: Home   Provider Location: [place] office   Reason for Visit:  Follow-up after colonoscopy, history of adenomatous colon polyps   Persons present on the virtual encounter, with roles:  Aliene Altes, PA-C (provider), Wayne Peterson (patient)   Total time (minutes) spent on medical discussion: 12 minutes  Virtual Visit via Telephone Note Due to COVID-19, visit is conducted virtually and was requested by patient.   I connected with Wayne Peterson on 07/28/19 at  8:00 AM EDT by video and verified that I am speaking with the correct person using two identifiers.   I discussed the limitations, risks, security and privacy concerns of performing an evaluation and management service by video and the availability of in person appointments. I also discussed with the patient that there may be a patient responsible charge related to this service. The patient expressed understanding and agreed to proceed.  Chief Complaint  Patient presents with  . pp f/u    doing ok    History of Present Illness: Wayne Peterson is a 44 y.o. male with a history of uncomplicated diverticulitis of the sigmoid colon in February 2021 presenting today for follow-up s/p colonoscopy.  Colonoscopy 06/16/2019 with diverticulosis in the sigmoid and descending colon (relatively minimal), nine 5-7 mm polyps in the rectum, sigmoid, descending, and ascending colon resected and retrieved, two 18-22 mm polyps in the sigmoid and ascending colon resected and retrieved in a piecemeal fashion s/p clip placement.  Largest ascending colon polyp was tattooed 1 cm distal to polypectomy site.  Recommended repeat colonoscopy in 6 months for surveillance. Pathology revealed multiple tubular adenomas without high-grade dysplasia as well as hyperplastic polyps in the rectum.  Today: No return of abdominal pain. Was taking MiraLAX daily for mild  constipation. About 1-2 weeks after TCS, he switched probiotics and this had really helped his bowel movements. BMs daily. No constipation or diarrhea. Stools are soft and formed. No blood in the stool or black stool. Weight is stable. No NSAIDs.  No upper GI concerns.  Denies nausea, vomiting, GERD symptoms, or dysphagia.   Past Medical History:  Diagnosis Date  . Colon polyps 06/16/2019   Nine 5-7 mm polyps, two 18-22 mm polyps, pathology with multiple tubular adenomas without high-grade dysplasia, also hyperplastic rectal polyps  . Diverticulitis   . History of kidney stones      Past Surgical History:  Procedure Laterality Date  . COLONOSCOPY N/A 06/16/2019   Procedure: COLONOSCOPY;  Surgeon: Daneil Dolin, MD; diverticulosis in sigmoid and descending colon, nine 5-7 mm polyps, two 18-22 mm polyps resected in piecemeal fashion s/p clip placement.  Pathology with multiple tubular adenomas without high-grade dysplasia as well as hyperplastic rectal polyps.  Repeat colonoscopy in 6 months.  . CYSTOSCOPY WITH RETROGRADE PYELOGRAM, URETEROSCOPY AND STENT PLACEMENT  08/26/2018   Procedure: CYSTOSCOPY WITH BILATERAL RETROGRADE PYELOGRAM, LEFT URETEROSCOPY AND LEFT STENT PLACEMENT;  Surgeon: Alexis Frock, MD;  Location: [facility];  Service: Urology;;  . HOLMIUM LASER APPLICATION Left [id number]   Procedure: HOLMIUM LASER APPLICATION;  Surgeon: Alexis Frock, MD;  Location: [facility];  Service: Urology;  Laterality: Left;  . WISDOM TOOTH EXTRACTION       Current Meds  Medication Sig  . Multiple Vitamin (MULTIVITAMIN WITH MINERALS) TABS tablet Take 1 tablet by mouth daily.  . Probiotic CAPS Take 1 capsule by mouth daily.  Marland Kitchen  VITAMIN D PO Take by mouth daily.     Family History  Problem Relation Age of Onset  . Cancer Mother   . Cirrhosis Father   . Colon polyps Maternal Grandfather   . Colon cancer Neg Hx     Social History   Socioeconomic  History  . Marital status: Married    Spouse name: Not on file  . Number of children: Not on file  . Years of education: Not on file  . Highest education level: Not on file  Occupational History  . Not on file  Tobacco Use  . Smoking status: Current Every Day Smoker    Packs/day: 0.50    Years: 23.00    Pack years: 11.50    Types: Cigarettes  . Smokeless tobacco: Never Used  Substance and Sexual Activity  . Alcohol use: Not Currently  . Drug use: Yes    Types: Marijuana    Comment: marijuana "hemp" over the weekend  . Sexual activity: Not Currently  Other Topics Concern  . Not on file  Social History Narrative  . Not on file   Social Determinants of Health   Financial Resource Strain:   . Difficulty of Paying Living Expenses:   Food Insecurity:   . Worried About Charity fundraiser in the Last Year:   . Arboriculturist in the Last Year:   Transportation Needs:   . Film/video editor (Medical):   Marland Kitchen Lack of Transportation (Non-Medical):   Physical Activity:   . Days of Exercise per Week:   . Minutes of Exercise per Session:   Stress:   . Feeling of Stress :   Social Connections:   . Frequency of Communication with Friends and Family:   . Frequency of Social Gatherings with Friends and Family:   . Attends Religious Services:   . Active Member of Clubs or Organizations:   . Attends Archivist Meetings:   Marland Kitchen Marital Status:     Review of Systems: Gen: Denies fever, chills, lightheadedness, dizziness, presyncope, syncope CV: Denies chest pain or heart palpitations. Resp: Denies shortness of breath or cough GI: see HPI  Heme: See HPI  Observations/Objective: No distress. Alert and oriented. Pleasant. Well nourished. Normal mood and affect. Unable to perform complete physical exam due to video encounter.   Assessment and Plan: 44 year old male with history of uncomplicated diverticulitis of the sigmoid colon in February 2021 presenting today for  follow-up s/p colonoscopy.  Colonoscopy 06/16/2019 with diverticulosis in the sigmoid and descending colon (relatively minimal), nine 5-7 mm polyps in the rectum, sigmoid, descending, and ascending colon resected and retrieved, two 18-22 mm polyps in the sigmoid and ascending colon resected and retrieved in a piecemeal fashion s/p clip placement.  Largest ascending colon polyp was tattooed 1 cm distal to polypectomy site.  Recommended repeat colonoscopy in 6 months for surveillance. Pathology revealed multiple tubular adenomas without high-grade dysplasia as well as hyperplastic polyps in the rectum.  Clinically, patient is doing very well with no return of abdominal pain.  Soft, formed BMs daily after changing his probiotic.  No BRBPR, melena, or any other significant upper or lower GI symptoms.   Plan to proceed with repeat colonoscopy in October 2021 with Dr. Gala Romney. The risks, benefits, and alternatives have been discussed in detail with patient. They have stated understanding and desire to proceed.  Continue daily probiotic. Advised he consider adding Benefiber daily Drinking of water to keep urine pale yellow to clear.  Follow-up as recommended at the time of colonoscopy.  Follow Up Instructions: Follow-up as recommended at the time of colonoscopy.   I discussed the assessment and treatment plan with the patient. The patient was provided an opportunity to ask questions and all were answered. The patient agreed with the plan and demonstrated an understanding of the instructions.   The patient was advised to call back or seek an in-person evaluation if the symptoms worsen or if the condition fails to improve as anticipated.  I provided 12 minutes of video-face-to-face time during this encounter.  Aliene Altes, PA-C [facility]

## 2019-07-28 ENCOUNTER — Encounter: Payer: Self-pay | Admitting: Gastroenterology

## 2019-07-28 ENCOUNTER — Telehealth (INDEPENDENT_AMBULATORY_CARE_PROVIDER_SITE_OTHER): Payer: BC Managed Care – PPO | Admitting: Gastroenterology

## 2019-07-28 DIAGNOSIS — Z86010 Personal history of colonic polyps: Secondary | ICD-10-CM | POA: Insufficient documentation

## 2019-07-28 DIAGNOSIS — K5732 Diverticulitis of large intestine without perforation or abscess without bleeding: Secondary | ICD-10-CM

## 2019-07-28 DIAGNOSIS — Z860101 Personal history of adenomatous and serrated colon polyps: Secondary | ICD-10-CM | POA: Insufficient documentation

## 2019-07-28 NOTE — Patient Instructions (Signed)
We will get you scheduled for a repeat colonoscopy in early October with Dr. Gala Romney to follow-up on the large colon polyps that you had removed in April.  Continue taking your probiotic daily as this has significantly helped with bowel regularity.  Would also recommend benefiber daily.   Be sure you are drinking enough water to keep your urine pale yellow to clear.  We will follow up with you in the office as Dr. Gala Romney recommends at the time of your colonoscopy.  Do not hesitate to call with questions or concerns prior.  Aliene Altes, PA-C [facility]

## 2019-07-28 NOTE — Progress Notes (Signed)
No pcp per patient 

## 2019-08-02 ENCOUNTER — Telehealth: Payer: Self-pay | Admitting: *Deleted

## 2019-08-02 NOTE — Telephone Encounter (Signed)
Pt consented to virtual visit on 07/28/19.

## 2019-08-02 NOTE — Telephone Encounter (Signed)
Wayne Peterson, you are scheduled for a virtual visit with your provider today.  Just as we do with appointments in the office, we must obtain your consent to participate.  Your consent will be active for this visit and any virtual visit you may have with one of our providers in the next 365 days.  If you have a MyChart account, I can also send a copy of this consent to you electronically.  All virtual visits are billed to your insurance company just like a traditional visit in the office.  As this is a virtual visit, video technology does not allow for your provider to perform a traditional examination.  This may limit your provider's ability to fully assess your condition.  If your provider identifies any concerns that need to be evaluated in person or the need to arrange testing such as labs, EKG, etc, we will make arrangements to do so.  Although advances in technology are sophisticated, we cannot ensure that it will always work on either your end or our end.  If the connection with a video visit is poor, we may have to switch to a telephone visit.  With either a video or telephone visit, we are not always able to ensure that we have a secure connection.   I need to obtain your verbal consent now.   Are you willing to proceed with your visit today?

## 2019-10-31 DIAGNOSIS — Z20822 Contact with and (suspected) exposure to covid-19: Secondary | ICD-10-CM | POA: Diagnosis not present

## 2019-12-11 ENCOUNTER — Telehealth: Payer: Self-pay

## 2019-12-11 NOTE — Telephone Encounter (Signed)
-----   Message from Daneil Dolin, MD sent at 12/10/2019  1:47 PM EDT ----- A good triage with update on meds should suffice.  I note he took 11 mg versed last time - but a lot of work to do.  Lets ask about his anesthesia last time.f he thought it was OK, we can do that way again.  O/W,  propofol. ----- Message ----- From: Claudina Lick, LPN Sent: 60/03/6578  10:28 AM EDT To: Daneil Dolin, MD  Dr.Rourk,  This pt is on the recall list to repeat his tcs in 6 months. Does he need an office visit?

## 2019-12-11 NOTE — Telephone Encounter (Signed)
Spoke with pt and he thought anesthesia was fine last time.  He is fine with it being doing that way again.  Scheduled nurse triage visit for 01/23/2020 at 3:00.

## 2020-01-23 ENCOUNTER — Ambulatory Visit (INDEPENDENT_AMBULATORY_CARE_PROVIDER_SITE_OTHER): Payer: Self-pay | Admitting: *Deleted

## 2020-01-23 ENCOUNTER — Other Ambulatory Visit: Payer: Self-pay

## 2020-01-23 VITALS — Ht 70.0 in | Wt 188.0 lb

## 2020-01-23 DIAGNOSIS — Z86010 Personal history of colonic polyps: Secondary | ICD-10-CM

## 2020-01-23 NOTE — Patient Instructions (Addendum)
[place]   Please notify us immediately if you are diabetic, take iron supplements, or if you are on coumadin or any blood thinners.   Patient Name: Wayne Peterson Date of procedure: 03/20/2020 Time to register at [place] Stay: 11:00 am Provider: Dr. Gala Romney   Purchase: MIRALAX 238 gram bottle, 1 FLEET ENEMA, 1 box of DULCOLAX (All over the counter medications)    03/18/2020- 2 Days prior to procedure: START CLEAR LIQUID DIET AFTER YOUR LUNCH MEAL--NO SOLID FOODS!   03/19/2020- 1 Day prior to procedure:   CLEAR LIQUIDS ALL DAY--NO SOLID FOODS!   Diabetic medication adjustments for today:    At 10:00 AM, take 2 DULCOLAX $RemoveBe'5mg'kwQkFsDrR$  tablets   At 12:00 PM, Mix 5 teaspoons of Miralax in any 4-6 ounces of CLEAR LIQUIDS (Gatorade) every hour for 5 hours until passing clear, watery stools. Be sure to drink 4 ounces of clear liquid 30 minutes after each dose of Miralax.   At 3:00 PM, take 2 Dulcolax $RemoveBe'5mg'ezfxuBSFr$  tablets   If stools are not clear & watery by 6:00 PM, take 5 teaspoons of Miralax every 30 minutes until stools are clear (no color)   You must have a complete prep to ensure the most effective cleaning.   CONTINUE CLEAR LIQUIDS ONLY UNTIL MIDNIGHT. Make a conscious effort to drink as much as you can before, during & after the preparation.    NOTHING TO EAT OR DRINK AFTER MIDNIGHT except for your heart, blood pressure & breathing medications. You may take them with a sip of clear liquids.    03/20/2020- Day of Procedure  Give yourself one Fleet enema about 1 hour prior to leaving for the hospital.   Diabetic medication adjustments for today:   You may take TYLENOL products. Please continue your regular medications unless we have instructed you otherwise.    Please note, on the day of your procedure you MUST be accompanied by an adult who is willing to assume responsibility for you at time of discharge. If you do not have such person with you, your  procedure will have to be rescheduled.                                                             Please leave ALL jewelry at home prior to coming to the hospital for your procedure.   *It is your responsibility to check with your insurance company for the benefits of coverage you have for this procedure. Unfortunately, not all insurance companies have benefits to cover all or part of these types of procedures. It is your responsibility to check your benefits, however we will be glad to assist you with any codes your insurance company may need.   Please note that most insurance companies will not cover a screening colonoscopy for people under the age of 58. For example, with some insurance companies you may have benefits for a screening colonoscopy, but if polyps are found the diagnosis will change and then you may have a deductible that will need to be met. Please make sure you check your benefits for screening colonoscopy as well as a diagnostic colonoscopy.    CLEAR LIQUIDS: (NO RED)  Jello Apple Juice White Grape Juice Water  Banana popsicles Kool-Aid Coffee(No cream or milk)  Tea (  No cream or milk) Soft drinks Broth (fat free beef/chicken/vegetable)   Clear liquids allow you to see your fingers on the other side of the glass. Be sure they are NOT RED in color, cloudy, but CLEAR.   Do Not Eat:  Dairy products of any kind Cranberry juice  Tomato or V8 Juice Orange Juice  Grapefruit Juice Red Grape Juice  Solid foods like cereal, oatmeal, yogurt, fruits, vegetables, creamed soups, eggs, bread, etc   HELPFUL HINTS TO MAKE DRINKING EASIER:  -Make sure prep is extremely COLD. Refrigerate the night before. You may also put in freezer.  -You may try mixing Crystal Light or Country Time Lemonade if you prefer. MIx in small amounts. Add more if necessary.  -Trying drinking through a straw.  -Rinse mouth with water or mouthwash  between glasses to remove aftertaste.  -Try sipping on a cold beverage/ice popsicles between glasses of prep.  -Place a piece of sugar-free hard candy in mouth between glasses.  -If you become nauseated, try consuming smaller amounts or stretch out the time between glasses. Stop for 30 minutes to an hour & slowly start back drinking.   Call our office with any questions or concerns at [phone].   Thank You

## 2020-01-23 NOTE — Progress Notes (Signed)
Gastroenterology Pre-Procedure Review  Request Date: 01/23/2020 Requesting Physician: ok to triage per Dr. Gala Romney, 6 month recall, Last TCS done 06/16/2019 by Dr. Gala Romney, (x2) 18 to 22 mm polyps, (x9) 5 to 9 mm polyps, piecemeal polypectomy, tubular adenoma, hyperplastic polyp  PATIENT REVIEW QUESTIONS: The patient responded to the following health history questions as indicated:    1. Diabetes Melitis: no 2. Joint replacements in the past 12 months: no 3. Major health problems in the past 3 months: no 4. Has an artificial valve or MVP: no 5. Has a defibrillator: no 6. Has been advised in past to take antibiotics in advance of a procedure like teeth cleaning: no 7. Family history of colon cancer: no  8. Alcohol Use: no 9. Illicit drug Use: no 10. History of sleep apnea: no  11. History of coronary artery or other vascular stents placed within the last 12 months: no 12. History of any prior anesthesia complications: no 13. Body mass index is 26.98 kg/m.    MEDICATIONS & ALLERGIES:    Patient reports the following regarding taking any blood thinners:   Plavix? no Aspirin? no Coumadin? no Brilinta? no Xarelto? no Eliquis? no Pradaxa? no Savaysa? no Effient? no  Patient confirms/reports the following medications:  Current Outpatient Medications  Medication Sig Dispense Refill   Multiple Vitamin (MULTIVITAMIN WITH MINERALS) TABS tablet Take 1 tablet by mouth daily.     Probiotic CAPS Take 1 capsule by mouth daily.     VITAMIN D PO Take by mouth daily.     No current facility-administered medications for this visit.    Patient confirms/reports the following allergies:  Allergies  Allergen Reactions   Penicillins Nausea And Vomiting    Did it involve swelling of the face/tongue/throat, SOB, or low BP? No Did it involve sudden or severe rash/hives, skin peeling, or any reaction on the inside of your mouth or nose? No Did you need to seek medical attention at a hospital or  doctor's office? No When did it last happen?Teenager If all above answers are NO, may proceed with cephalosporin use.     No orders of the defined types were placed in this encounter.   AUTHORIZATION INFORMATION Primary Insurance: [place],  [place] # [id number]: ,  Group #: [id number] Pre-Cert / Auth required: No, submit to local BCBS  SCHEDULE INFORMATION: Procedure has been scheduled as follows:  Date: 03/20/2020, Time: 12:00 Location: [facility] with Dr. Gala Romney  This Gastroenterology Pre-Precedure Review Form is being routed to the following provider(s): Aliene Altes, PA

## 2020-01-25 NOTE — Progress Notes (Signed)
OK to schedule. ASA II 

## 2020-03-18 ENCOUNTER — Telehealth: Payer: Self-pay | Admitting: Internal Medicine

## 2020-03-18 ENCOUNTER — Other Ambulatory Visit (HOSPITAL_COMMUNITY)
Admission: RE | Admit: 2020-03-18 | Discharge: 2020-03-18 | Disposition: A | Discharge status: Home / Self Care | Payer: BC Managed Care – PPO | Source: Ambulatory Visit | Attending: Internal Medicine | Admitting: Internal Medicine

## 2020-03-18 NOTE — Telephone Encounter (Signed)
Spoke to pt and he rescheduled his Covid screening to 03/19/2020 at 8:00 due to dental emergency that came up.

## 2020-03-18 NOTE — Telephone Encounter (Signed)
Spoke with pt.  He said that he got put on clindamycin after his root canal today.  Wanted to inform us.  Informed him to hold it the morning of procedure.  Pt voiced understanding.  Routing to [employer], [place] as Juluis Rainier.

## 2020-03-18 NOTE — Telephone Encounter (Signed)
Looks like his procedure is at 12:00pm. He should be fine to take his antibiotic with only a sip of water as long as he takes it first thing in the morning, no later than 7:30 am.

## 2020-03-18 NOTE — Telephone Encounter (Signed)
[phone]  Please call patient, he has an upcoming procedure and was put on antibiotics, needs to know if he can still have his [rpcedure

## 2020-03-18 NOTE — Telephone Encounter (Signed)
[phone] please call patient about his procedure, he has a dental emergency and can not get his covid test done at the time it is scheduled    Will he need to reschedule his procedure

## 2020-03-19 ENCOUNTER — Other Ambulatory Visit (HOSPITAL_COMMUNITY)
Admission: RE | Admit: 2020-03-19 | Discharge: 2020-03-19 | Disposition: A | Discharge status: Home / Self Care | Payer: BC Managed Care – PPO | Source: Ambulatory Visit | Attending: Internal Medicine | Admitting: Internal Medicine

## 2020-03-19 ENCOUNTER — Telehealth: Payer: Self-pay | Admitting: *Deleted

## 2020-03-19 ENCOUNTER — Other Ambulatory Visit: Payer: Self-pay

## 2020-03-19 DIAGNOSIS — Z01812 Encounter for preprocedural laboratory examination: Secondary | ICD-10-CM | POA: Insufficient documentation

## 2020-03-19 DIAGNOSIS — D127 Benign neoplasm of rectosigmoid junction: Secondary | ICD-10-CM | POA: Diagnosis not present

## 2020-03-19 DIAGNOSIS — Z79899 Other long term (current) drug therapy: Secondary | ICD-10-CM | POA: Diagnosis not present

## 2020-03-19 DIAGNOSIS — Z87442 Personal history of urinary calculi: Secondary | ICD-10-CM | POA: Diagnosis not present

## 2020-03-19 DIAGNOSIS — Z880 Allergy status to penicillin: Secondary | ICD-10-CM | POA: Diagnosis not present

## 2020-03-19 DIAGNOSIS — Z8379 Family history of other diseases of the digestive system: Secondary | ICD-10-CM | POA: Diagnosis not present

## 2020-03-19 DIAGNOSIS — Z86010 Personal history of colonic polyps: Secondary | ICD-10-CM | POA: Diagnosis not present

## 2020-03-19 DIAGNOSIS — Z1211 Encounter for screening for malignant neoplasm of colon: Secondary | ICD-10-CM | POA: Diagnosis not present

## 2020-03-19 DIAGNOSIS — Z20822 Contact with and (suspected) exposure to covid-19: Secondary | ICD-10-CM | POA: Insufficient documentation

## 2020-03-19 DIAGNOSIS — Z809 Family history of malignant neoplasm, unspecified: Secondary | ICD-10-CM | POA: Diagnosis not present

## 2020-03-19 DIAGNOSIS — D123 Benign neoplasm of transverse colon: Secondary | ICD-10-CM | POA: Diagnosis not present

## 2020-03-19 DIAGNOSIS — Z8371 Family history of colonic polyps: Secondary | ICD-10-CM | POA: Diagnosis not present

## 2020-03-19 LAB — SARS CORONAVIRUS 2 (TAT 6-24 HRS): SARS Coronavirus 2: NEGATIVE

## 2020-03-19 NOTE — Telephone Encounter (Signed)
Called pt. He was agreeable to moving patient procedure time up to 10:30am, arrival 9:30am. He is aware he will stay on clear liquids until midnight and npo midnight. He voiced understanding. Called endo and made aware

## 2020-03-19 NOTE — Telephone Encounter (Signed)
Noted.  Called pt and informed him.  He said that his procedure got moved up to 9:30 so he is going to just hold off on taking it in the morning.

## 2020-03-20 ENCOUNTER — Ambulatory Visit (HOSPITAL_COMMUNITY)
Admission: RE | Admit: 2020-03-20 | Discharge: 2020-03-20 | Disposition: A | Discharge status: Home / Self Care | Payer: BC Managed Care – PPO | Attending: Internal Medicine | Admitting: Internal Medicine

## 2020-03-20 ENCOUNTER — Encounter (HOSPITAL_COMMUNITY)
Admission: RE | Disposition: A | Discharge status: Home / Self Care | Payer: Self-pay | Source: Home / Self Care | Attending: Internal Medicine

## 2020-03-20 ENCOUNTER — Encounter (HOSPITAL_COMMUNITY): Payer: Self-pay | Admitting: Internal Medicine

## 2020-03-20 ENCOUNTER — Other Ambulatory Visit: Payer: Self-pay

## 2020-03-20 DIAGNOSIS — Z809 Family history of malignant neoplasm, unspecified: Secondary | ICD-10-CM | POA: Insufficient documentation

## 2020-03-20 DIAGNOSIS — Z87442 Personal history of urinary calculi: Secondary | ICD-10-CM | POA: Insufficient documentation

## 2020-03-20 DIAGNOSIS — Z79899 Other long term (current) drug therapy: Secondary | ICD-10-CM | POA: Diagnosis not present

## 2020-03-20 DIAGNOSIS — Z1211 Encounter for screening for malignant neoplasm of colon: Secondary | ICD-10-CM | POA: Diagnosis not present

## 2020-03-20 DIAGNOSIS — D127 Benign neoplasm of rectosigmoid junction: Secondary | ICD-10-CM | POA: Diagnosis not present

## 2020-03-20 DIAGNOSIS — Z8371 Family history of colonic polyps: Secondary | ICD-10-CM | POA: Diagnosis not present

## 2020-03-20 DIAGNOSIS — Z20822 Contact with and (suspected) exposure to covid-19: Secondary | ICD-10-CM | POA: Diagnosis not present

## 2020-03-20 DIAGNOSIS — Z86010 Personal history of colonic polyps: Secondary | ICD-10-CM | POA: Diagnosis not present

## 2020-03-20 DIAGNOSIS — D123 Benign neoplasm of transverse colon: Secondary | ICD-10-CM | POA: Diagnosis not present

## 2020-03-20 DIAGNOSIS — Z8379 Family history of other diseases of the digestive system: Secondary | ICD-10-CM | POA: Insufficient documentation

## 2020-03-20 DIAGNOSIS — Z880 Allergy status to penicillin: Secondary | ICD-10-CM | POA: Insufficient documentation

## 2020-03-20 HISTORY — PX: POLYPECTOMY: SHX149

## 2020-03-20 HISTORY — PX: COLONOSCOPY: SHX5424

## 2020-03-20 SURGERY — COLONOSCOPY
Anesthesia: Moderate Sedation

## 2020-03-20 MED ORDER — STERILE WATER FOR IRRIGATION IR SOLN
Status: DC | PRN
  Administered 2020-03-20: 100 mL

## 2020-03-20 MED ORDER — MIDAZOLAM HCL 5 MG/5ML IJ SOLN
INTRAMUSCULAR | Status: AC
  Filled 2020-03-20: qty 10

## 2020-03-20 MED ORDER — MEPERIDINE HCL 100 MG/ML IJ SOLN
INTRAMUSCULAR | Status: DC | PRN
  Administered 2020-03-20: 40 mg
  Administered 2020-03-20: 10 mg via INTRAVENOUS

## 2020-03-20 MED ORDER — MIDAZOLAM HCL 5 MG/5ML IJ SOLN
INTRAMUSCULAR | Status: DC | PRN
  Administered 2020-03-20 (×2): 1 mg via INTRAVENOUS
  Administered 2020-03-20: 2 mg via INTRAVENOUS
  Administered 2020-03-20 (×2): 1 mg via INTRAVENOUS
  Administered 2020-03-20: 2 mg via INTRAVENOUS

## 2020-03-20 MED ORDER — ONDANSETRON HCL 4 MG/2ML IJ SOLN
INTRAMUSCULAR | Status: DC | PRN
  Administered 2020-03-20: 4 mg via INTRAVENOUS

## 2020-03-20 MED ORDER — ONDANSETRON HCL 4 MG/2ML IJ SOLN
INTRAMUSCULAR | Status: AC
  Filled 2020-03-20: qty 2

## 2020-03-20 MED ORDER — SODIUM CHLORIDE 0.9 % IV SOLN
INTRAVENOUS | Status: DC
  Administered 2020-03-20: 1000 mL via INTRAVENOUS

## 2020-03-20 MED ORDER — MEPERIDINE HCL 50 MG/ML IJ SOLN
INTRAMUSCULAR | Status: AC
  Filled 2020-03-20: qty 1

## 2020-03-20 NOTE — Op Note (Signed)
[facility] Patient Name: Wayne Peterson Procedure Date: 03/20/2020 9:45 AM MRN: [id number] Date of Birth: [birth year] Attending MD: Norvel Richards , MD CSN: [id number] Age: 45 Admit Type: Outpatient Procedure:                Colonoscopy Indications:              High risk colon cancer surveillance: Personal                            history of colonic polyps Providers:                Norvel Richards, MD, Crystal Page, Raphael Gibney, Technician Referring MD:              Medicines:                Midazolam 8 mg IV, Meperidine 50 mg IV Complications:            No immediate complications. Estimated Blood Loss:     Estimated blood loss was minimal. Estimated blood                            loss was minimal. Procedure:                Pre-Anesthesia Assessment:                           - Prior to the procedure, a History and Physical                            was performed, and patient medications and                            allergies were reviewed. The patient's tolerance of                            previous anesthesia was also reviewed. The risks                            and benefits of the procedure and the sedation                            options and risks were discussed with the patient.                            All questions were answered, and informed consent                            was obtained. Prior Anticoagulants: The patient has                            taken no previous anticoagulant or antiplatelet  agents. ASA Grade Assessment: II - A patient with                            mild systemic disease. After reviewing the risks                            and benefits, the patient was deemed in                            satisfactory condition to undergo the procedure.                           After obtaining informed consent, the colonoscope                            was passed under direct  vision. Throughout the                            procedure, the patient's blood pressure, pulse, and                            oxygen saturations were monitored continuously. The                            CF-[id number]) scope was introduced through                            the anus and advanced to the the cecum, identified                            by appendiceal orifice and ileocecal valve. The                            colonoscopy was performed without difficulty. The                            patient tolerated the procedure well. The quality                            of the bowel preparation was adequate. Scope In: 10:25:57 AM Scope Out: 10:49:05 AM Scope Withdrawal Time: 0 hours 17 minutes 9 seconds  Total Procedure Duration: 0 hours 23 minutes 8 seconds  Findings:      The perianal and digital rectal examinations were normal.      Seven sessile polyps were found in the recto-sigmoid colon and mid       transverse colon. The polyps were 1 to 2 mm in size. These polyps were       removed with a cold snare. Resection and retrieval were complete.       Estimated blood loss was minimal.      The exam was otherwise without abnormality on direct and retroflexion       views. Previously noted tattoo in the ascending colon readily       identified. No evidence of residual polyp at prior polypectomy site  Scattered medium-mouthed diverticula were found in the sigmoid colon and       descending colon. Impression:               - Seven 1 to 2 mm polyps at the recto-sigmoid colon                            and in the mid transverse colon, removed with a                            cold snare. Resected and retrieved.                           - The examination was otherwise normal on direct                            and retroflexion views. Moderate Sedation:      Moderate (conscious) sedation was administered by the endoscopy nurse       and supervised by the endoscopist. The  following parameters were       monitored: oxygen saturation, heart rate, blood pressure, respiratory       rate, EKG, adequacy of pulmonary ventilation, and response to care.       Total physician intraservice time was 30 minutes. Recommendation:           - Patient has a contact number available for                            emergencies. The signs and symptoms of potential                            delayed complications were discussed with the                            patient. Return to normal activities tomorrow.                            Written discharge instructions were provided to the                            patient.                           - Resume previous diet.                           - Continue present medications.                           - Repeat colonoscopy date to be determined after                            pending pathology results are reviewed for                            surveillance based on pathology results.                           -  Return to GI office (date not yet determined). Procedure Code(s):        --- Professional ---                           [phone], Colonoscopy, flexible; with removal of                            tumor(s), polyp(s), or other lesion(s) by snare                            technique                           99153, Moderate sedation; each additional 15                            minutes intraservice time                           G0500, Moderate sedation services provided by the                            same physician or other qualified health care                            professional performing a gastrointestinal                            endoscopic service that sedation supports,                            requiring the presence of an independent trained                            observer to assist in the monitoring of the                            patient's level of consciousness and physiological                             status; initial 15 minutes of intra-service time;                            patient age 62 years or older (additional time may                            be reported with [phone], as appropriate) Diagnosis Code(s):        --- Professional ---                           Z86.010, Personal history of colonic polyps                           K63.5, Polyp of colon CPT copyright 2019 American Medical Association. All rights reserved. The codes documented in this  report are preliminary and upon coder review may  be revised to meet current compliance requirements. Cristopher Estimable. Britiany Silbernagel, MD Norvel Richards, MD 03/20/2020 11:01:11 AM This report has been signed electronically. Number of Addenda: 0

## 2020-03-20 NOTE — H&P (Signed)
@LOGO @   Primary Care Physician:  Patient, No Pcp Per Primary Gastroenterologist:  Dr. Gala Romney  Pre-Procedure History & Physical: HPI:  Wayne Peterson is a 45 y.o. male here for multiple colonic adenomas removed from patient's colon in April of last year.;  Is here for early surveillance per plan.  Past Medical History:  Diagnosis Date  . Colon polyps 06/16/2019   Nine 5-7 mm polyps, two 18-22 mm polyps, pathology with multiple tubular adenomas without high-grade dysplasia, also hyperplastic rectal polyps  . Diverticulitis   . History of kidney stones     Past Surgical History:  Procedure Laterality Date  . COLONOSCOPY N/A 06/16/2019   Procedure: COLONOSCOPY;  Surgeon: Daneil Dolin, MD; diverticulosis in sigmoid and descending colon, nine 5-7 mm polyps, two 18-22 mm polyps resected in piecemeal fashion s/p clip placement.  Pathology with multiple tubular adenomas without high-grade dysplasia as well as hyperplastic rectal polyps.  Repeat colonoscopy in 6 months.  . CYSTOSCOPY WITH RETROGRADE PYELOGRAM, URETEROSCOPY AND STENT PLACEMENT  08/26/2018   Procedure: CYSTOSCOPY WITH BILATERAL RETROGRADE PYELOGRAM, LEFT URETEROSCOPY AND LEFT STENT PLACEMENT;  Surgeon: Alexis Frock, MD;  Location: [facility];  Service: Urology;;  . HOLMIUM LASER APPLICATION Left 5/32/9924   Procedure: HOLMIUM LASER APPLICATION;  Surgeon: Alexis Frock, MD;  Location: [facility];  Service: Urology;  Laterality: Left;  . WISDOM TOOTH EXTRACTION      Prior to Admission medications   Medication Sig Start Date End Date Taking? Authorizing Provider  clindamycin (CLEOCIN) 150 MG capsule Take 150 mg by mouth 3 (three) times daily.   Yes [provider]  Cholecalciferol (VITAMIN D3) 50 MCG (2000 UT) TABS Take 4,000 mg by mouth at bedtime.    [provider]  Misc Natural Products (ELDERBERRY ZINC/VIT C/IMMUNE MT) Take 1 tablet by mouth at bedtime. Elderberry 50  mg-Zinc 7.5 mg    [provider]  Multiple Vitamin (MULTIVITAMIN WITH MINERALS) TABS tablet Take 1 tablet by mouth at bedtime.    [provider]  Probiotic Product (PROBIOTIC PO) Take 1 capsule by mouth at bedtime.    [provider]    Allergies as of 01/25/2020 - Review Complete 01/23/2020  Allergen Reaction Noted  . Penicillins Nausea And Vomiting 06/17/2018    Family History  Problem Relation Age of Onset  . Cancer Mother   . Cirrhosis Father   . Colon polyps Maternal Grandfather   . Colon cancer Neg Hx     Social History   Socioeconomic History  . Marital status: Married    Spouse name: Not on file  . Number of children: Not on file  . Years of education: Not on file  . Highest education level: Not on file  Occupational History  . Not on file  Tobacco Use  . Smoking status: Current Every Day Smoker    Packs/day: 0.50    Years: 23.00    Pack years: 11.50    Types: Cigarettes  . Smokeless tobacco: Never Used  Vaping Use  . Vaping Use: Never used  Substance and Sexual Activity  . Alcohol use: Not Currently  . Drug use: Yes    Types: Marijuana    Comment: marijuana "hemp" over the weekend  . Sexual activity: Not Currently  Other Topics Concern  . Not on file  Social History Narrative  . Not on file   Social Determinants of Health   Financial Resource Strain: Not on file  Food Insecurity: Not  on file  Transportation Needs: Not on file  Physical Activity: Not on file  Stress: Not on file  Social Connections: Not on file  Intimate Partner Violence: Not on file    Review of Systems: See HPI, otherwise negative ROS  Physical Exam: There were no vitals taken for this visit. General:   Alert,  Well-developed, well-nourished, pleasant and cooperative in NAD SNeck:  Supple; no masses or thyromegaly. No significant cervical adenopathy. Lungs:  Clear throughout to auscultation.   No wheezes, crackles, or rhonchi. No acute  distress. Heart:  Regular rate and rhythm; no murmurs, clicks, rubs,  or gallops. Abdomen: Non-distended, normal bowel sounds.  Soft and nontender without appreciable mass or hepatosplenomegaly.  Pulses:  Normal pulses noted. Extremities:  Without clubbing or edema.  Impression/Plan: Pleasant 45 year old gentleman multiple colonic adenomas removed last year; here for early surveillance colonoscopy per plan. The risks, benefits, limitations, alternatives and imponderables have been reviewed with the patient. Questions have been answered. All parties are agreeable.      Notice: This dictation was prepared with Dragon dictation along with smaller phrase technology. Any transcriptional errors that result from this process are unintentional and may not be corrected upon review.

## 2020-03-20 NOTE — Discharge Instructions (Signed)
Colonoscopy Discharge Instructions  Read the instructions outlined below and refer to this sheet in the next few weeks. These discharge instructions provide you with general information on caring for yourself after you leave the hospital. Your doctor may also give you specific instructions. While your treatment has been planned according to the most current medical practices available, unavoidable complications occasionally occur. If you have any problems or questions after discharge, call Dr. Gala Romney at [phone]. ACTIVITY  You may resume your regular activity, but move at a slower pace for the next 24 hours.   Take frequent rest periods for the next 24 hours.   Walking will help get rid of the air and reduce the bloated feeling in your belly (abdomen).   No driving for 24 hours (because of the medicine (anesthesia) used during the test).    Do not sign any important legal documents or operate any machinery for 24 hours (because of the anesthesia used during the test).  NUTRITION  Drink plenty of fluids.   You may resume your normal diet as instructed by your doctor.   Begin with a light meal and progress to your normal diet. Heavy or fried foods are harder to digest and may make you feel sick to your stomach (nauseated).   Avoid alcoholic beverages for 24 hours or as instructed.  MEDICATIONS  You may resume your normal medications unless your doctor tells you otherwise.  WHAT YOU CAN EXPECT TODAY  Some feelings of bloating in the abdomen.   Passage of more gas than usual.   Spotting of blood in your stool or on the toilet paper.  IF YOU HAD POLYPS REMOVED DURING THE COLONOSCOPY:  No aspirin products for 7 days or as instructed.   No alcohol for 7 days or as instructed.   Eat a soft diet for the next 24 hours.  FINDING OUT THE RESULTS OF YOUR TEST Not all test results are available during your visit. If your test results are not back during the visit, make an appointment  with your caregiver to find out the results. Do not assume everything is normal if you have not heard from your caregiver or the medical facility. It is important for you to follow up on all of your test results.  SEEK IMMEDIATE MEDICAL ATTENTION IF:  You have more than a spotting of blood in your stool.   Your belly is swollen (abdominal distention).   You are nauseated or vomiting.   You have a temperature over 101.   You have abdominal pain or discomfort that is severe or gets worse throughout the day.   Diverticulosis and colon polyp information provided  7 tiny polyps removed from your colon today.  There was no sign of the larger polyps previously removed  Further recommendations to follow pending review of pathology report  At patient request, I called Randall Hiss at [phone] -reviewed results and recommendations   Colon Polyps  Colon polyps are tissue growths inside the colon, which is part of the large intestine. They are one of the types of polyps that can grow in the body. A polyp may be a round bump or a mushroom-shaped growth. You could have one polyp or more than one. Most colon polyps are noncancerous (benign). However, some colon polyps can become cancerous over time. Finding and removing the polyps early can help prevent this. What are the causes? The exact cause of colon polyps is not known. What increases the risk? The following factors may make you  more likely to develop this condition:  Having a family history of colorectal cancer or colon polyps.  Being older than 43 years of age.  Being younger than 43 years of age and having a significant family history of colorectal cancer or colon polyps or a genetic condition that puts you at higher risk of getting colon polyps.  Having inflammatory bowel disease, such as ulcerative colitis or Crohn's disease.  Having certain conditions passed from parent to child (hereditary conditions), such as: ? Familial adenomatous  polyposis (FAP). ? Lynch syndrome. ? Turcot syndrome. ? Peutz-Jeghers syndrome. ? MUTYH-associated polyposis (MAP).  Being overweight.  Certain lifestyle factors. These include smoking cigarettes, drinking too much alcohol, not getting enough exercise, and eating a diet that is high in fat and red meat and low in fiber.  Having had childhood cancer that was treated with radiation of the abdomen. What are the signs or symptoms? Many times, there are no symptoms. If you have symptoms, they may include:  Blood coming from the rectum during a bowel movement.  Blood in the stool (feces). The blood may be bright red or very dark in color.  Pain in the abdomen.  A change in bowel habits, such as constipation or diarrhea. How is this diagnosed? This condition is diagnosed with a colonoscopy. This is a procedure in which a lighted, flexible scope is inserted into the opening between the buttocks (anus) and then passed into the colon to examine the area. Polyps are sometimes found when a colonoscopy is done as part of routine cancer screening tests. How is this treated? This condition is treated by removing any polyps that are found. Most polyps can be removed during a colonoscopy. Those polyps will then be tested for cancer. Additional treatment may be needed depending on the results of testing. Follow these instructions at home: Eating and drinking  Eat foods that are high in fiber, such as fruits, vegetables, and whole grains.  Eat foods that are high in calcium and vitamin D, such as milk, cheese, yogurt, eggs, liver, fish, and broccoli.  Limit foods that are high in fat, such as fried foods and desserts.  Limit the amount of red meat, precooked or cured meat, or other processed meat that you eat, such as hot dogs, sausages, bacon, or meat loaves.  Limit sugary drinks.   Lifestyle  Maintain a healthy weight, or lose weight if recommended by your health care provider.  Exercise  every day or as told by your health care provider.  Do not use any products that contain nicotine or tobacco, such as cigarettes, e-cigarettes, and chewing tobacco. If you need help quitting, ask your health care provider.  Do not drink alcohol if: ? Your health care provider tells you not to drink. ? You are pregnant, may be pregnant, or are planning to become pregnant.  If you drink alcohol: ? Limit how much you use to:  0-1 drink a day for women.  0-2 drinks a day for men. ? Know how much alcohol is in your drink. In the [place] one drink equals one 12 oz bottle of beer (355 mL), one 5 oz glass of wine (148 mL), or one 1 oz glass of hard liquor (44 mL). General instructions  Take over-the-counter and prescription medicines only as told by your health care provider.  Keep all follow-up visits. This is important. This includes having regularly scheduled colonoscopies. Talk to your health care provider about when you need a colonoscopy. Contact a health  care provider if:  You have new or worsening bleeding during a bowel movement.  You have new or increased blood in your stool.  You have a change in bowel habits.  You lose weight for no known reason. Summary  Colon polyps are tissue growths inside the colon, which is part of the large intestine. They are one type of polyp that can grow in the body.  Most colon polyps are noncancerous (benign), but some can become cancerous over time.  This condition is diagnosed with a colonoscopy.  This condition is treated by removing any polyps that are found. Most polyps can be removed during a colonoscopy. This information is not intended to replace advice given to you by your health care provider. Make sure you discuss any questions you have with your health care provider. Document Revised: 06/14/2019 Document Reviewed: 06/14/2019 Elsevier Patient Education  2021 [employer].  Diverticulosis  Diverticulosis is a condition that  develops when small pouches (diverticula) form in the wall of the large intestine (colon). The colon is where water is absorbed and stool (feces) is formed. The pouches form when the inside layer of the colon pushes through weak spots in the outer layers of the colon. You may have a few pouches or many of them. The pouches usually do not cause problems unless they become inflamed or infected. When this happens, the condition is called diverticulitis. What are the causes? The cause of this condition is not known. What increases the risk? The following factors may make you more likely to develop this condition:  Being older than age 59. Your risk for this condition increases with age. Diverticulosis is rare among people younger than age 29. By age 27, many people have it.  Eating a low-fiber diet.  Having frequent constipation.  Being overweight.  Not getting enough exercise.  Smoking.  Taking over-the-counter pain medicines, like aspirin and ibuprofen.  Having a family history of diverticulosis. What are the signs or symptoms? In most people, there are no symptoms of this condition. If you do have symptoms, they may include:  Bloating.  Cramps in the abdomen.  Constipation or diarrhea.  Pain in the lower left side of the abdomen. How is this diagnosed? Because diverticulosis usually has no symptoms, it is most often diagnosed during an exam for other colon problems. The condition may be diagnosed by:  Using a flexible scope to examine the colon (colonoscopy).  Taking an X-ray of the colon after dye has been put into the colon (barium enema).  Having a CT scan. How is this treated? You may not need treatment for this condition. Your health care provider may recommend treatment to prevent problems. You may need treatment if you have symptoms or if you previously had diverticulitis. Treatment may include:  Eating a high-fiber diet.  Taking a fiber supplement.  Taking a live  bacteria supplement (probiotic).  Taking medicine to relax your colon.   Follow these instructions at home: Medicines  Take over-the-counter and prescription medicines only as told by your health care provider.  If told by your health care provider, take a fiber supplement or probiotic. Constipation prevention Your condition may cause constipation. To prevent or treat constipation, you may need to:  Drink enough fluid to keep your urine pale yellow.  Take over-the-counter or prescription medicines.  Eat foods that are high in fiber, such as beans, whole grains, and fresh fruits and vegetables.  Limit foods that are high in fat and processed sugars, such  as fried or sweet foods.   General instructions  Try not to strain when you have a bowel movement.  Keep all follow-up visits as told by your health care provider. This is important. Contact a health care provider if you:  Have pain in your abdomen.  Have bloating.  Have cramps.  Have not had a bowel movement in 3 days. Get help right away if:  Your pain gets worse.  Your bloating becomes very bad.  You have a fever or chills, and your symptoms suddenly get worse.  You vomit.  You have bowel movements that are bloody or black.  You have bleeding from your rectum. Summary  Diverticulosis is a condition that develops when small pouches (diverticula) form in the wall of the large intestine (colon).  You may have a few pouches or many of them.  This condition is most often diagnosed during an exam for other colon problems.  Treatment may include increasing the fiber in your diet, taking supplements, or taking medicines. This information is not intended to replace advice given to you by your health care provider. Make sure you discuss any questions you have with your health care provider. Document Revised: 09/22/2018 Document Reviewed: 09/22/2018 Elsevier Patient Education  [employer] [employer].

## 2020-03-21 ENCOUNTER — Encounter: Payer: Self-pay | Admitting: Internal Medicine

## 2020-03-21 LAB — SURGICAL PATHOLOGY

## 2020-03-28 ENCOUNTER — Encounter (HOSPITAL_COMMUNITY): Payer: Self-pay | Admitting: Internal Medicine

## 2020-04-30 IMAGING — CT CT RENAL STONE PROTOCOL
2 of 4 series · 15 of 46 positions shown, 17 images · non-contrast
Comparison: None.

CLINICAL DATA: Left flank pain.

EXAM:
CT ABDOMEN AND PELVIS WITHOUT CONTRAST
TECHNIQUE: Multidetector CT imaging of the abdomen and pelvis was performed
following the standard protocol without IV contrast.

[Series 2: axial st · axial · 0.81mm/px · z∈[+738,+1148]mm · 12 of 94 slices shown, 14 images]
[im 8/94  soft-tissue]
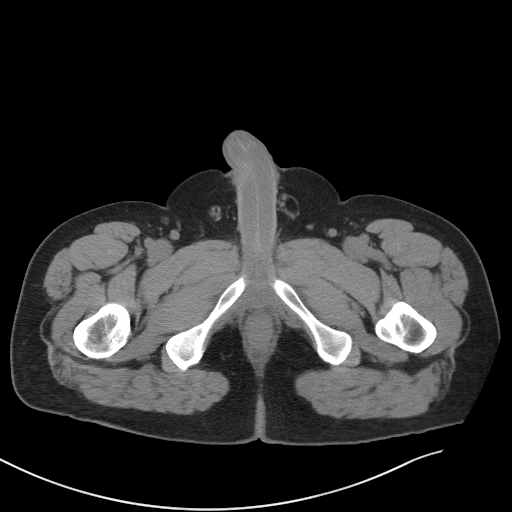
[im 8/94  bone]
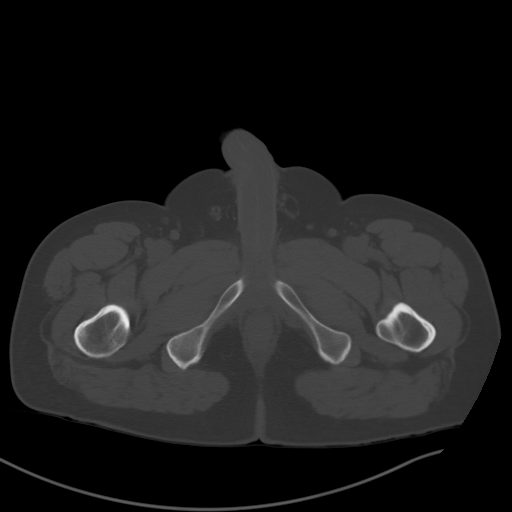
[im 15/94  soft-tissue]
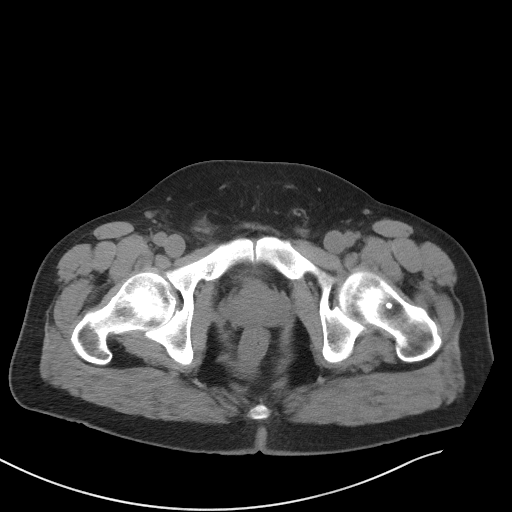
[im 23/94  soft-tissue]
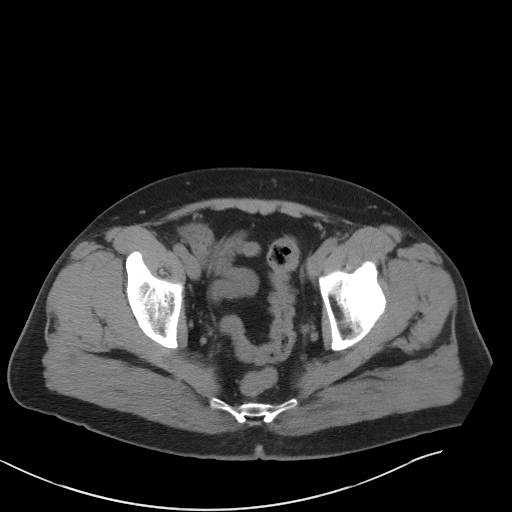
[im 30/94  soft-tissue]
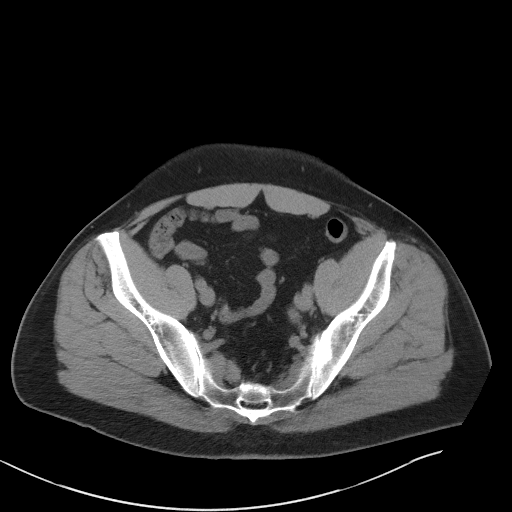
[im 38/94  soft-tissue]
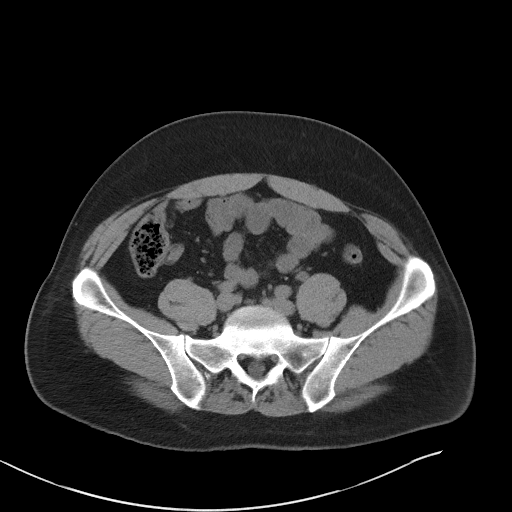
[im 45/94  soft-tissue]
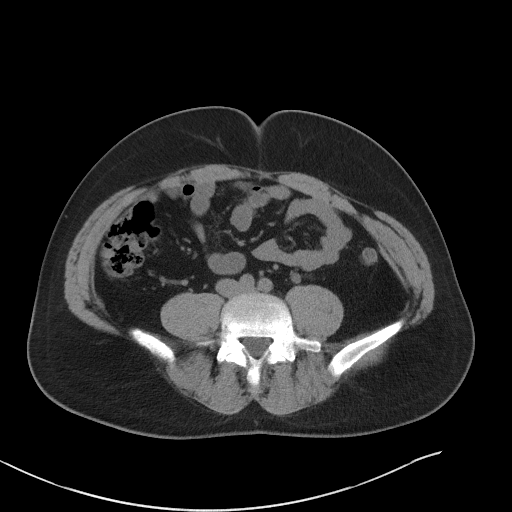
[im 53/94  soft-tissue]
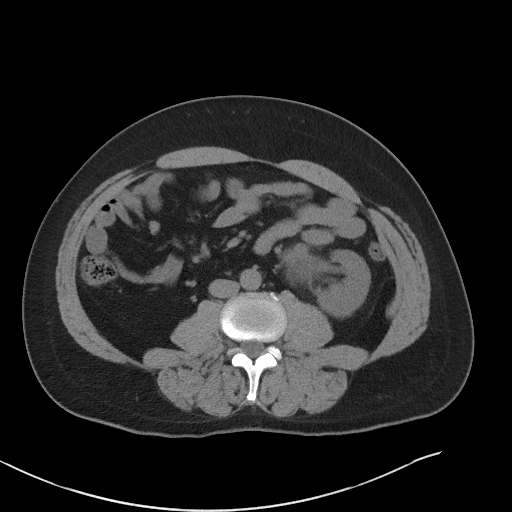
[im 60/94  soft-tissue]
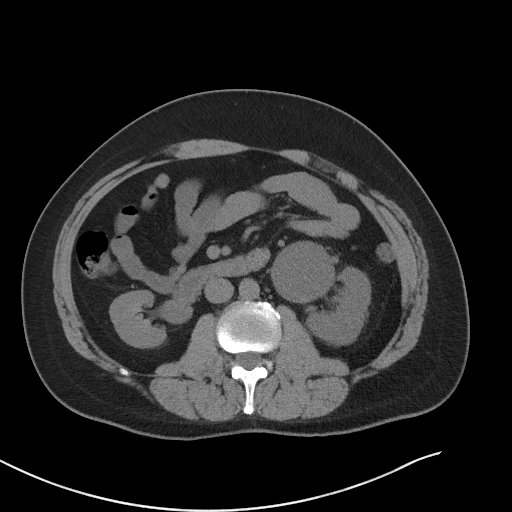
[im 67/94  soft-tissue]
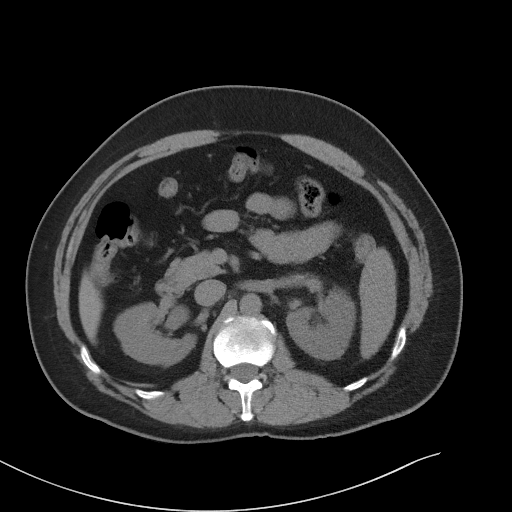
[im 67/94  bone]
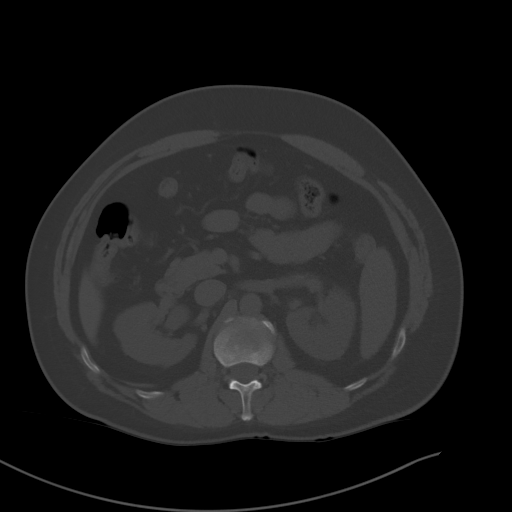
[im 75/94  soft-tissue]
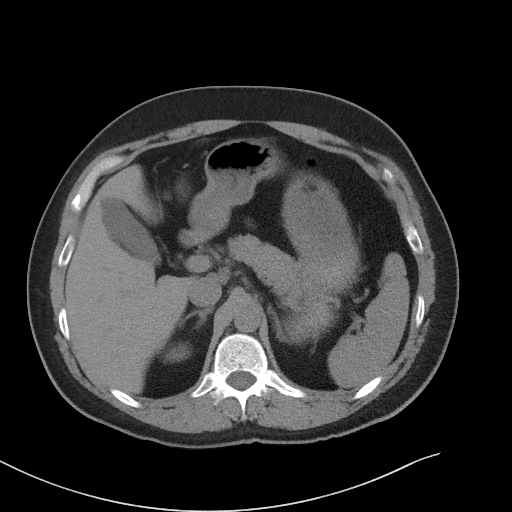
[im 82/94  soft-tissue]
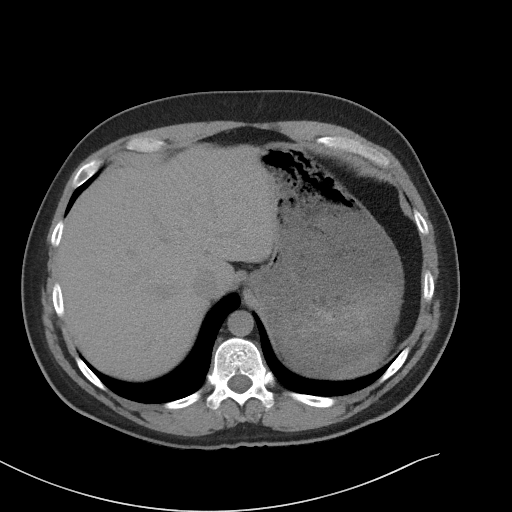
[im 90/94  soft-tissue]
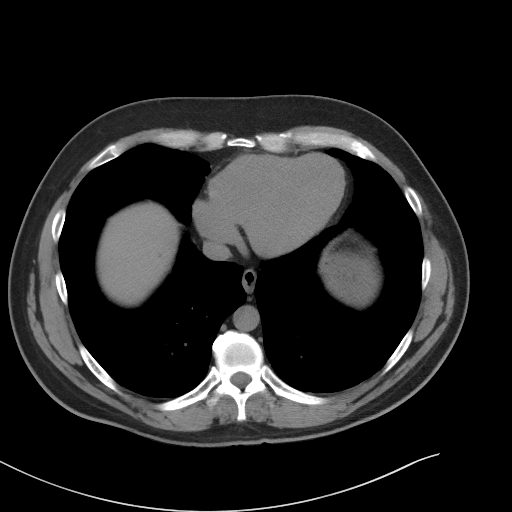

[Series 5: coronal st · coronal · 0.77mm/px · 3 of 110 slices shown]
[im 37/110  soft-tissue]
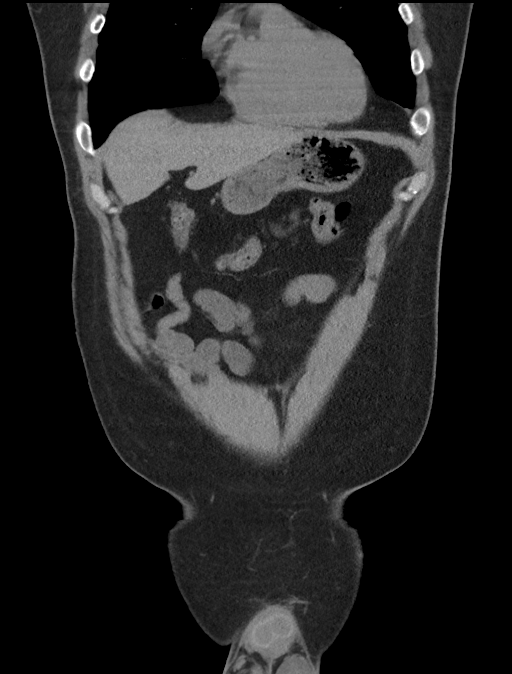
[im 49/110  soft-tissue]
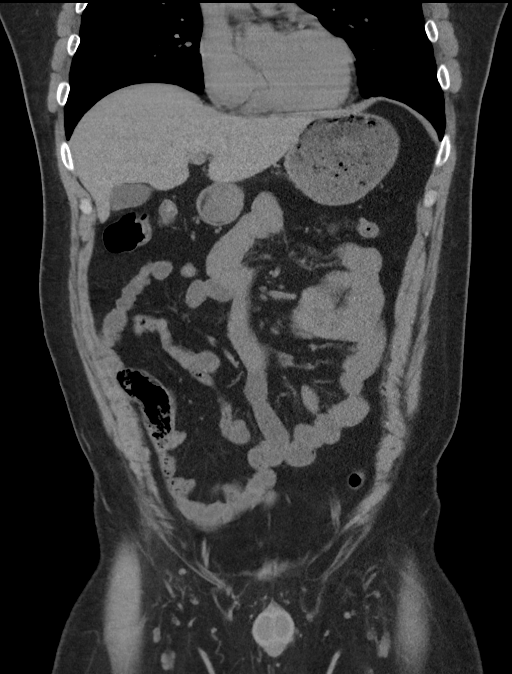
[im 61/110  soft-tissue]
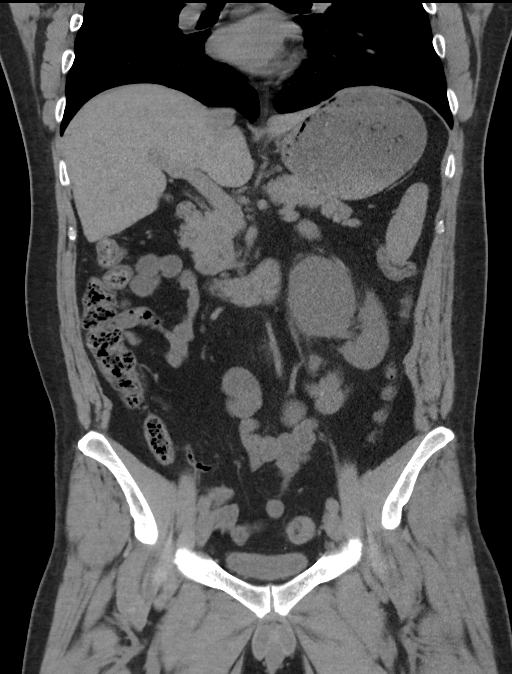

[15 of 46 positions shown; findings below may reference images not displayed]

FINDINGS: Lower chest: Minor left lower lobe atelectasis.

Hepatobiliary: No focal liver abnormality is seen. No gallstones,
gallbladder wall thickening, or biliary dilatation.

Pancreas: No ductal dilatation or inflammation.

Spleen: Normal in size without focal abnormality.

Adrenals/Urinary Tract: Normal adrenal glands. 3 mm obstructing
stone at the left ureterovesicular junction with moderate
hydroureteronephrosis. Possible underlying extrarenal pelvis
configuration with renal pelvis dilatation out of proportion to
calyceal dilatation. Mild left perinephric edema. There is a
dependent 12 x 14 mm nonobstructing stone in the mid renal pelvis,
as well as additional smaller nonobstructing intrarenal stones.
Probable extrarenal pelvis of the right kidney without calyceal
dilatation. No right perinephric edema or renal stones. Right
ureters decompressed. Urinary bladder partially distended. No
bladder stone.

Stomach/Bowel: Stomach is distended with ingested contents. Appendix
appears normal. No evidence of bowel wall thickening, distention, or
inflammatory changes. Minimal distal diverticulosis without
diverticulitis.

Vascular/Lymphatic: Normal caliber abdominal aorta. No enlarged
lymph nodes in the abdomen or pelvis.

Reproductive: Prostate is unremarkable.

Other: No free air free fluid. Small amount of fat in both inguinal
canals.

Musculoskeletal: There are no acute or suspicious osseous
abnormalities.
IMPRESSION: 1. Obstructing 3 mm stone at the left ureterovesicular junction with
moderate hydroureteronephrosis.
2. Additional nonobstructing stones in the left kidney, including a
12 x 14 mm dependent stone in the renal pelvis.
3. Probable extrarenal pelvis configuration of both kidneys.
4. Minor distal colonic diverticulosis without diverticulitis.

## 2021-04-08 ENCOUNTER — Ambulatory Visit
Admission: EM | Admit: 2021-04-08 | Discharge: 2021-04-08 | Disposition: A | Discharge status: Home / Self Care | Payer: 59 | Attending: Emergency Medicine | Admitting: Emergency Medicine

## 2021-04-08 ENCOUNTER — Encounter: Payer: Self-pay | Admitting: Emergency Medicine

## 2021-04-08 ENCOUNTER — Other Ambulatory Visit: Payer: Self-pay

## 2021-04-08 DIAGNOSIS — Z9189 Other specified personal risk factors, not elsewhere classified: Secondary | ICD-10-CM

## 2021-04-08 NOTE — ED Triage Notes (Signed)
Here for covid test only

## 2021-04-09 LAB — SARS-COV-2, NAA 2 DAY TAT

## 2021-04-09 LAB — NOVEL CORONAVIRUS, NAA: SARS-CoV-2, NAA: NOT DETECTED

## 2022-12-15 ENCOUNTER — Encounter: Payer: Self-pay | Admitting: Emergency Medicine

## 2022-12-15 ENCOUNTER — Ambulatory Visit
Admission: EM | Admit: 2022-12-15 | Discharge: 2022-12-15 | Disposition: A | Discharge status: Home / Self Care | Payer: 59 | Attending: Nurse Practitioner | Admitting: Nurse Practitioner

## 2022-12-15 DIAGNOSIS — R21 Rash and other nonspecific skin eruption: Secondary | ICD-10-CM | POA: Diagnosis not present

## 2022-12-15 MED ORDER — HYDROCORTISONE 2.5 % EX CREA: TOPICAL_CREAM | Freq: Two times a day (BID) | CUTANEOUS | 0 refills | Status: DC

## 2022-12-15 MED ORDER — PREDNISOLONE 15 MG/5ML PO SOLN: 50.0000 mg | Freq: Every day | ORAL | 0 refills | Status: AC

## 2022-12-15 NOTE — ED Provider Notes (Signed)
[facility]    CSN: [id number] Arrival date & time: 12/15/22  1804      History   Chief Complaint No chief complaint on file.   HPI Wayne Peterson is a 47 y.o. male.   The history is provided by the patient.   Patient presents for complaints of rash around the left eye, on the back of the neck, and on his hands that started over the past several days.  Patient states that the rash is minimally itchy.  He states he was out mowing, and may have come into contact poison ivy.  He has not used any medication for his symptoms.  Past Medical History:  Diagnosis Date   Colon polyps 06/16/2019   Nine 5-7 mm polyps, two 18-22 mm polyps, pathology with multiple tubular adenomas without high-grade dysplasia, also hyperplastic rectal polyps   Diverticulitis    History of kidney stones     Patient Active Problem List   Diagnosis Date Noted   History of adenomatous polyp of colon 07/28/2019   Diverticulitis of colon 04/24/2019    Past Surgical History:  Procedure Laterality Date   COLONOSCOPY N/A 06/16/2019   Procedure: COLONOSCOPY;  Surgeon: Corbin Ade, MD; diverticulosis in sigmoid and descending colon, nine 5-7 mm polyps, two 18-22 mm polyps resected in piecemeal fashion s/p clip placement.  Pathology with multiple tubular adenomas without high-grade dysplasia as well as hyperplastic rectal polyps.  Repeat colonoscopy in 6 months.   COLONOSCOPY N/A 03/20/2020   Procedure: COLONOSCOPY;  Surgeon: Corbin Ade, MD;  Location: [facility];  Service: Endoscopy;  Laterality: N/A;  12:00   CYSTOSCOPY WITH RETROGRADE PYELOGRAM, URETEROSCOPY AND STENT PLACEMENT  08/26/2018   Procedure: CYSTOSCOPY WITH BILATERAL RETROGRADE PYELOGRAM, LEFT URETEROSCOPY AND LEFT STENT PLACEMENT;  Surgeon: Sebastian Ache, MD;  Location: [facility];  Service: Urology;;   HOLMIUM LASER APPLICATION Left 08/26/2018   Procedure: HOLMIUM LASER APPLICATION;  Surgeon: Sebastian Ache,  MD;  Location: [facility];  Service: Urology;  Laterality: Left;   POLYPECTOMY  03/20/2020   Procedure: POLYPECTOMY INTESTINAL;  Surgeon: Corbin Ade, MD;  Location: [facility];  Service: Endoscopy;;   WISDOM TOOTH EXTRACTION         Home Medications    Prior to Admission medications   Medication Sig Start Date End Date Taking? Authorizing Provider  hydrocortisone 2.5 % cream Apply topically 2 (two) times daily. 12/15/22  Yes Annalisia Ingber-Warren, Sadie Haber, NP  prednisoLONE (PRELONE) 15 MG/5ML SOLN Take 16.7 mLs (50 mg total) by mouth daily for 5 days. 12/15/22 12/20/22 Yes Gregory Barrick-Warren, Sadie Haber, NP  Cholecalciferol (VITAMIN D3) 50 MCG (2000 UT) TABS Take 4,000 mg by mouth at bedtime.    [provider]  Misc Natural Products (ELDERBERRY ZINC/VIT C/IMMUNE MT) Take 1 tablet by mouth at bedtime. Elderberry 50 mg-Zinc 7.5 mg    [provider]  Multiple Vitamin (MULTIVITAMIN WITH MINERALS) TABS tablet Take 1 tablet by mouth at bedtime.    [provider]  Probiotic Product (PROBIOTIC PO) Take 1 capsule by mouth at bedtime.    [provider]    Family History Family History  Problem Relation Age of Onset   Cancer Mother    Cirrhosis Father    Colon polyps Maternal Grandfather    Colon cancer Neg Hx     Social History Social History   Tobacco Use   Smoking status: Every Day    Current packs/day: 0.50  Average packs/day: 0.5 packs/day for 23.0 years (11.5 ttl pk-yrs)    Types: Cigarettes   Smokeless tobacco: Never  Vaping Use   Vaping status: Never Used  Substance Use Topics   Alcohol use: Not Currently   Drug use: Yes    Types: Marijuana    Comment: marijuana "hemp" over the weekend     Allergies   Penicillins   Review of Systems Review of Systems Per HPI  Physical Exam Triage Vital Signs ED Triage Vitals  Encounter Vitals Group     BP 12/15/22 1810 133/81     Systolic BP Percentile --      Diastolic BP  Percentile --      Pulse Rate 12/15/22 1810 88     Resp 12/15/22 1810 18     Temp 12/15/22 1810 99.1 F (37.3 C)     Temp Source 12/15/22 1810 Oral     SpO2 12/15/22 1810 98 %     Weight --      Height --      Head Circumference --      Peak Flow --      Pain Score 12/15/22 1811 0     Pain Loc --      Pain Education --      Exclude from Growth Chart --    No data found.  Updated Vital Signs BP 133/81 (BP Location: Right Arm)   Pulse 88   Temp 99.1 F (37.3 C) (Oral)   Resp 18   SpO2 98%   Visual Acuity Right Eye Distance:   Left Eye Distance:   Bilateral Distance:    Right Eye Near:   Left Eye Near:    Bilateral Near:     Physical Exam Vitals and nursing note reviewed.  Constitutional:      General: He is not in acute distress.    Appearance: Normal appearance.  HENT:     Head: Normocephalic.  Eyes:     Extraocular Movements: Extraocular movements intact.     Pupils: Pupils are equal, round, and reactive to light.  Skin:    General: Skin is warm and dry.     Findings: Rash present. Rash is macular and papular.     Comments: Erythematous maculopapular rash noted to the left periorbital region, nape of the neck, and to the bilateral hands.  There is no oozing, fluctuance, or drainage present.  Neurological:     General: No focal deficit present.     Mental Status: He is alert and oriented to person, place, and time.  Psychiatric:        Mood and Affect: Mood normal.        Behavior: Behavior normal.      UC Treatments / Results  Labs (all labs ordered are listed, but only abnormal results are displayed) Labs Reviewed - No data to display  EKG   Radiology No results found.  Procedures Procedures (including critical care time)  Medications Ordered in UC Medications - No data to display  Initial Impression / Assessment and Plan / UC Course  I have reviewed the triage vital signs and the nursing notes.  Pertinent labs & imaging results that  were available during my care of the patient were reviewed by me and considered in my medical decision making (see chart for details).  Patient is well-appearing, he is in no acute distress, vital signs are stable.  Patient with rash and other nonspecific skin eruption, symptoms appear to be consistent with  poison ivy dermatitis.  Will provide symptomatic treatment with Prelone 40 mg daily for the next 5 days.  Patient was also prescribed hydrocortisone 2.5% cream to apply topically to the affected areas for itching.  Supportive care recommendations were provided and discussed with the patient to include over-the-counter antihistamines, cool cloth to the affected area, and use of Aveeno colloidal oatmeal bath.  Patient advised to follow-up in this clinic or with his PCP if symptoms fail to improve.  Patient is in agreement with this plan of care and verbalizes understanding.  All questions were answered.  Patient stable for discharge.  Final Clinical Impressions(s) / UC Diagnoses   Final diagnoses:  Rash and nonspecific skin eruption     Discharge Instructions      Take medication as prescribed. May also take over-the-counter Zyrtec during the daytime and Benadryl at nighttime as needed for itching.  Avoid hot baths or showers while symptoms persist.  Recommend taking lukewarm baths. May apply cool cloths to the area to help with itching or discomfort. Avoid scratching, rubbing, or manipulating the areas while symptoms persist. Recommend Aveeno colloidal oatmeal bath to use to help with drying and itching. If symptoms do not improve with this treatment, you may follow-up in this clinic or with your primary care physician for further evaluation. Follow-up as needed.     ED Prescriptions     Medication Sig Dispense Auth. Provider   prednisoLONE (PRELONE) 15 MG/5ML SOLN Take 16.7 mLs (50 mg total) by mouth daily for 5 days. 83.5 mL Catherine Cubero-Warren, Sadie Haber, NP   hydrocortisone 2.5 %  cream Apply topically 2 (two) times daily. 30 g Jamason Peckham-Warren, Sadie Haber, NP      PDMP not reviewed this encounter.   Abran Cantor, NP 12/15/22 1939

## 2022-12-15 NOTE — ED Triage Notes (Signed)
Redness and swelling around left eye since yesterday.

## 2022-12-15 NOTE — Discharge Instructions (Signed)
Take medication as prescribed. May also take over-the-counter Zyrtec during the daytime and Benadryl at nighttime as needed for itching.  Avoid hot baths or showers while symptoms persist.  Recommend taking lukewarm baths. May apply cool cloths to the area to help with itching or discomfort. Avoid scratching, rubbing, or manipulating the areas while symptoms persist. Recommend Aveeno colloidal oatmeal bath to use to help with drying and itching. If symptoms do not improve with this treatment, you may follow-up in this clinic or with your primary care physician for further evaluation. Follow-up as needed.

## 2023-01-05 ENCOUNTER — Encounter: Payer: Self-pay | Admitting: *Deleted

## 2023-01-05 ENCOUNTER — Telehealth: Payer: Self-pay | Admitting: *Deleted

## 2023-01-05 ENCOUNTER — Encounter: Payer: Self-pay | Admitting: Internal Medicine

## 2023-01-05 ENCOUNTER — Other Ambulatory Visit: Payer: Self-pay | Admitting: *Deleted

## 2023-01-05 ENCOUNTER — Ambulatory Visit: Payer: 59 | Admitting: Internal Medicine

## 2023-01-05 VITALS — BP 137/75 | HR 86 | Temp 98.6°F | Ht 70.0 in | Wt 206.0 lb

## 2023-01-05 DIAGNOSIS — Z860101 Personal history of adenomatous and serrated colon polyps: Secondary | ICD-10-CM | POA: Diagnosis not present

## 2023-01-05 DIAGNOSIS — R102 Pelvic and perineal pain: Secondary | ICD-10-CM

## 2023-01-05 DIAGNOSIS — R194 Change in bowel habit: Secondary | ICD-10-CM

## 2023-01-05 MED ORDER — PEG 3350-KCL-NA BICARB-NACL 420 G PO SOLR: 4000.0000 mL | Freq: Once | ORAL | 0 refills | Status: AC

## 2023-01-05 NOTE — Progress Notes (Unsigned)
Primary Care Physician:  Patient, No Pcp Per Primary Gastroenterologist:  Dr. Jena Gauss  Pre-Procedure History & Physical: HPI:  Wayne Peterson is a 47 y.o. male here for for evaluation of a few week history of altered bowel function feels he does not always evacuate like he previously did.  May have a bowel movement the morning he feels like he needs to go more later in the day has not been bleeding.  At the same time he has had some left lower ischial tenderness.  No real radiating pain comes and goes cannot say if it is related to bowel function or not.  No trauma.  History of kidney stones but symptoms were different previously he had hematuria previously but none lately.  Multiple adenomas removed from his colon 2022; he is just about due for follow-up colonoscopy.  He is very concerned that polyps may have recurred.  He denies any true foregut pain.  Past Medical History:  Diagnosis Date   Colon polyps 06/16/2019   Nine 5-7 mm polyps, two 18-22 mm polyps, pathology with multiple tubular adenomas without high-grade dysplasia, also hyperplastic rectal polyps   Diverticulitis    History of kidney stones     Past Surgical History:  Procedure Laterality Date   COLONOSCOPY N/A 06/16/2019   Procedure: COLONOSCOPY;  Surgeon: Corbin Ade, MD; diverticulosis in sigmoid and descending colon, nine 5-7 mm polyps, two 18-22 mm polyps resected in piecemeal fashion s/p clip placement.  Pathology with multiple tubular adenomas without high-grade dysplasia as well as hyperplastic rectal polyps.  Repeat colonoscopy in 6 months.   COLONOSCOPY N/A 03/20/2020   Procedure: COLONOSCOPY;  Surgeon: Corbin Ade, MD;  Location: [facility];  Service: Endoscopy;  Laterality: N/A;  12:00   CYSTOSCOPY WITH RETROGRADE PYELOGRAM, URETEROSCOPY AND STENT PLACEMENT  08/26/2018   Procedure: CYSTOSCOPY WITH BILATERAL RETROGRADE PYELOGRAM, LEFT URETEROSCOPY AND LEFT STENT PLACEMENT;  Surgeon: Sebastian Ache,  MD;  Location: [facility];  Service: Urology;;   HOLMIUM LASER APPLICATION Left 08/26/2018   Procedure: HOLMIUM LASER APPLICATION;  Surgeon: Sebastian Ache, MD;  Location: [facility];  Service: Urology;  Laterality: Left;   POLYPECTOMY  03/20/2020   Procedure: POLYPECTOMY INTESTINAL;  Surgeon: Corbin Ade, MD;  Location: [facility];  Service: Endoscopy;;   WISDOM TOOTH EXTRACTION      Prior to Admission medications   Medication Sig Start Date End Date Taking? Authorizing Provider  Cholecalciferol (VITAMIN D3) 50 MCG (2000 UT) TABS Take 4,000 mg by mouth at bedtime.   Yes [provider]  Multiple Vitamin (MULTIVITAMIN WITH MINERALS) TABS tablet Take 1 tablet by mouth at bedtime.   Yes [provider]  Probiotic Product (PROBIOTIC PO) Take 1 capsule by mouth at bedtime.   Yes [provider]    Allergies as of 01/05/2023 - Review Complete 01/05/2023  Allergen Reaction Noted   Penicillins Nausea And Vomiting 06/17/2018    Family History  Problem Relation Age of Onset   Cancer Mother    Cirrhosis Father    Colon polyps Maternal Grandfather    Colon cancer Neg Hx     Social History   Socioeconomic History   Marital status: Married    Spouse name: Not on file   Number of children: Not on file   Years of education: Not on file   Highest education level: Not on file  Occupational History   Not on file  Tobacco Use  Smoking status: Every Day    Current packs/day: 0.50    Average packs/day: 0.5 packs/day for 23.0 years (11.5 ttl pk-yrs)    Types: Cigarettes   Smokeless tobacco: Never  Vaping Use   Vaping status: Never Used  Substance and Sexual Activity   Alcohol use: Not Currently   Drug use: Yes    Types: Marijuana    Comment: marijuana "hemp" over the weekend   Sexual activity: Not Currently  Other Topics Concern   Not on file  Social History Narrative   Not on file   Social Determinants of Health    Financial Resource Strain: Not on file  Food Insecurity: Not on file  Transportation Needs: Not on file  Physical Activity: Not on file  Stress: Not on file  Social Connections: Not on file  Intimate Partner Violence: Not on file    Review of Systems: See HPI, otherwise negative ROS  Physical Exam: BP 137/75 (BP Location: Right Arm, Patient Position: Sitting, Cuff Size: Normal)   Pulse 86   Temp 98.6 F (37 C) (Oral)   Ht 5\' 10"  (1.778 m)   Wt 206 lb (93.4 kg)   SpO2 97%   BMI 29.56 kg/m  General:   Alert,  Well-developed, well-nourished, pleasant and cooperative in NAD Lungs:  Clear throughout to auscultation.   No wheezes, crackles, or rhonchi. No acute distress. Heart:  Regular rate and rhythm; no murmurs, clicks, rubs,  or gallops. Abdomen: Non-distended, normal bowel sounds.  Soft and nontender without appreciable mass or hepatosplenomegaly.  Pulses:  Normal pulses noted. Extremities:  Without clubbing or edema.  Impression/Plan: 47 year old gentleman with vague nonspecific change in bowel habits over the past few weeks.  His left posterior ischial pain may or may not have anything to do with bowel function although it is possible he could have some discomfort posteriorly in the setting of constipation.  He is very much concerned about polyps and cancer.  He is essentially due for surveillance colonoscopy.  That is not at all unreasonable at this time.  Clinically, does not sound like a active kidney stone.  I explained to him that findings of colonoscopy may not explain his pain but it would be worthwhile to facilitate bowel function to see if better bowel function lessens his pain  Recommendations   Because of your altered bowel function and history of polyps I agree we should repeat your colonoscopy now as discussed  We will schedule a surveillance colonoscopy-history of polyps-ASA 2  You are describing left lower pelvic pain in the back which may or may not be  related to your GI tract.  Further evaluation may be needed depending on findings of colonoscopy.  I also recommend we improve your bowel function with Linzess 145 daily.  Samples for 2 weeks.  Call me in 2 weeks and let me know how Linzess is working for you.  Further recommendations to follow   Notice: This dictation was prepared with Dragon dictation along with smaller phrase technology. Any transcriptional errors that result from this process are unintentional and may not be corrected upon review.

## 2023-01-05 NOTE — Telephone Encounter (Signed)
[employer] PA: CPT Code GECOL Description: Colonoscopy Authorization Number: [id number] Case Number: [id number] Review Date: 01/05/2023 3:07:35 PM Expiration Date: 04/05/2023 Status: Your case has been Approved.

## 2023-01-05 NOTE — H&P (View-Only) (Signed)
Primary Care Physician:  Patient, No Pcp Per Primary Gastroenterologist:  Dr. Jena Gauss  Pre-Procedure History & Physical: HPI:  Wayne Peterson is a 47 y.o. male here for for evaluation of a few week history of altered bowel function feels he does not always evacuate like he previously did.  May have a bowel movement the morning he feels like he needs to go more later in the day has not been bleeding.  At the same time he has had some left lower ischial tenderness.  No real radiating pain comes and goes cannot say if it is related to bowel function or not.  No trauma.  History of kidney stones but symptoms were different previously he had hematuria previously but none lately.  Multiple adenomas removed from his colon 2022; he is just about due for follow-up colonoscopy.  He is very concerned that polyps may have recurred.  He denies any true foregut pain.  Past Medical History:  Diagnosis Date   Colon polyps 06/16/2019   Nine 5-7 mm polyps, two 18-22 mm polyps, pathology with multiple tubular adenomas without high-grade dysplasia, also hyperplastic rectal polyps   Diverticulitis    History of kidney stones     Past Surgical History:  Procedure Laterality Date   COLONOSCOPY N/A 06/16/2019   Procedure: COLONOSCOPY;  Surgeon: Corbin Ade, MD; diverticulosis in sigmoid and descending colon, nine 5-7 mm polyps, two 18-22 mm polyps resected in piecemeal fashion s/p clip placement.  Pathology with multiple tubular adenomas without high-grade dysplasia as well as hyperplastic rectal polyps.  Repeat colonoscopy in 6 months.   COLONOSCOPY N/A 03/20/2020   Procedure: COLONOSCOPY;  Surgeon: Corbin Ade, MD;  Location: [facility];  Service: Endoscopy;  Laterality: N/A;  12:00   CYSTOSCOPY WITH RETROGRADE PYELOGRAM, URETEROSCOPY AND STENT PLACEMENT  08/26/2018   Procedure: CYSTOSCOPY WITH BILATERAL RETROGRADE PYELOGRAM, LEFT URETEROSCOPY AND LEFT STENT PLACEMENT;  Surgeon: Sebastian Ache,  MD;  Location: [facility];  Service: Urology;;   HOLMIUM LASER APPLICATION Left 08/26/2018   Procedure: HOLMIUM LASER APPLICATION;  Surgeon: Sebastian Ache, MD;  Location: [facility];  Service: Urology;  Laterality: Left;   POLYPECTOMY  03/20/2020   Procedure: POLYPECTOMY INTESTINAL;  Surgeon: Corbin Ade, MD;  Location: [facility];  Service: Endoscopy;;   WISDOM TOOTH EXTRACTION      Prior to Admission medications   Medication Sig Start Date End Date Taking? Authorizing Provider  Cholecalciferol (VITAMIN D3) 50 MCG (2000 UT) TABS Take 4,000 mg by mouth at bedtime.   Yes [provider]  Multiple Vitamin (MULTIVITAMIN WITH MINERALS) TABS tablet Take 1 tablet by mouth at bedtime.   Yes [provider]  Probiotic Product (PROBIOTIC PO) Take 1 capsule by mouth at bedtime.   Yes [provider]    Allergies as of 01/05/2023 - Review Complete 01/05/2023  Allergen Reaction Noted   Penicillins Nausea And Vomiting 06/17/2018    Family History  Problem Relation Age of Onset   Cancer Mother    Cirrhosis Father    Colon polyps Maternal Grandfather    Colon cancer Neg Hx     Social History   Socioeconomic History   Marital status: Married    Spouse name: Not on file   Number of children: Not on file   Years of education: Not on file   Highest education level: Not on file  Occupational History   Not on file  Tobacco Use  Smoking status: Every Day    Current packs/day: 0.50    Average packs/day: 0.5 packs/day for 23.0 years (11.5 ttl pk-yrs)    Types: Cigarettes   Smokeless tobacco: Never  Vaping Use   Vaping status: Never Used  Substance and Sexual Activity   Alcohol use: Not Currently   Drug use: Yes    Types: Marijuana    Comment: marijuana "hemp" over the weekend   Sexual activity: Not Currently  Other Topics Concern   Not on file  Social History Narrative   Not on file   Social Determinants of Health    Financial Resource Strain: Not on file  Food Insecurity: Not on file  Transportation Needs: Not on file  Physical Activity: Not on file  Stress: Not on file  Social Connections: Not on file  Intimate Partner Violence: Not on file    Review of Systems: See HPI, otherwise negative ROS  Physical Exam: BP 137/75 (BP Location: Right Arm, Patient Position: Sitting, Cuff Size: Normal)   Pulse 86   Temp 98.6 F (37 C) (Oral)   Ht 5\' 10"  (1.778 m)   Wt 206 lb (93.4 kg)   SpO2 97%   BMI 29.56 kg/m  General:   Alert,  Well-developed, well-nourished, pleasant and cooperative in NAD Lungs:  Clear throughout to auscultation.   No wheezes, crackles, or rhonchi. No acute distress. Heart:  Regular rate and rhythm; no murmurs, clicks, rubs,  or gallops. Abdomen: Non-distended, normal bowel sounds.  Soft and nontender without appreciable mass or hepatosplenomegaly.  Pulses:  Normal pulses noted. Extremities:  Without clubbing or edema.  Impression/Plan: 47 year old gentleman with vague nonspecific change in bowel habits over the past few weeks.  His left posterior ischial pain may or may not have anything to do with bowel function although it is possible he could have some discomfort posteriorly in the setting of constipation.  He is very much concerned about polyps and cancer.  He is essentially due for surveillance colonoscopy.  That is not at all unreasonable at this time.  Clinically, does not sound like a active kidney stone.  I explained to him that findings of colonoscopy may not explain his pain but it would be worthwhile to facilitate bowel function to see if better bowel function lessens his pain  Recommendations   Because of your altered bowel function and history of polyps I agree we should repeat your colonoscopy now as discussed  We will schedule a surveillance colonoscopy-history of polyps-ASA 2  You are describing left lower pelvic pain in the back which may or may not be  related to your GI tract.  Further evaluation may be needed depending on findings of colonoscopy.  I also recommend we improve your bowel function with Linzess 145 daily.  Samples for 2 weeks.  Call me in 2 weeks and let me know how Linzess is working for you.  Further recommendations to follow   Notice: This dictation was prepared with Dragon dictation along with smaller phrase technology. Any transcriptional errors that result from this process are unintentional and may not be corrected upon review.

## 2023-01-05 NOTE — Patient Instructions (Signed)
It was good to see you again today!  Because of your altered bowel function and history of polyps I agree we should repeat your colonoscopy now as discussed  We will schedule a surveillance colonoscopy-history of polyps-ASA 2  You are describing left lower pelvic pain in the back which may or may not be related to your GI tract.  Further evaluation may be needed depending on findings of colonoscopy.  I also recommend we improve your bowel function with Linzess 145 daily.  Samples for 2 weeks.  Call me in 2 weeks and let me know how Linzess is working for you.  Further recommendations to follow

## 2023-01-06 ENCOUNTER — Telehealth: Payer: Self-pay | Admitting: *Deleted

## 2023-01-06 NOTE — Telephone Encounter (Signed)
Pt called in. Wanted to move procedure up to 11/7 from 11/14. Procedure moved. New instructions sent via mychart. Message sent to endo to change.

## 2023-01-12 ENCOUNTER — Encounter: Payer: Self-pay | Admitting: *Deleted

## 2023-01-12 ENCOUNTER — Telehealth: Payer: Self-pay | Admitting: *Deleted

## 2023-01-12 NOTE — Telephone Encounter (Signed)
Pt called and wanted to do the miralax prep instead of doing trilyte.  Advised pt that provider has been having issues with pt's doing the smaller volume prep, so he would prefer pt to have the larger volume prep. Pt verbalized understanding

## 2023-01-14 ENCOUNTER — Ambulatory Visit (HOSPITAL_COMMUNITY)
Admission: RE | Admit: 2023-01-14 | Discharge: 2023-01-14 | Disposition: A | Discharge status: Home / Self Care | Payer: 59 | Attending: Internal Medicine | Admitting: Internal Medicine

## 2023-01-14 ENCOUNTER — Ambulatory Visit (HOSPITAL_COMMUNITY): Payer: 59 | Admitting: Anesthesiology

## 2023-01-14 ENCOUNTER — Other Ambulatory Visit: Payer: Self-pay

## 2023-01-14 ENCOUNTER — Encounter (HOSPITAL_COMMUNITY)
Admission: RE | Disposition: A | Discharge status: Home / Self Care | Payer: Self-pay | Source: Home / Self Care | Attending: Internal Medicine

## 2023-01-14 ENCOUNTER — Encounter (HOSPITAL_COMMUNITY): Payer: Self-pay | Admitting: Internal Medicine

## 2023-01-14 ENCOUNTER — Telehealth: Payer: Self-pay

## 2023-01-14 DIAGNOSIS — D123 Benign neoplasm of transverse colon: Secondary | ICD-10-CM | POA: Insufficient documentation

## 2023-01-14 DIAGNOSIS — Z860101 Personal history of adenomatous and serrated colon polyps: Secondary | ICD-10-CM | POA: Diagnosis not present

## 2023-01-14 DIAGNOSIS — Z1211 Encounter for screening for malignant neoplasm of colon: Secondary | ICD-10-CM | POA: Diagnosis present

## 2023-01-14 DIAGNOSIS — D124 Benign neoplasm of descending colon: Secondary | ICD-10-CM | POA: Diagnosis not present

## 2023-01-14 DIAGNOSIS — Z09 Encounter for follow-up examination after completed treatment for conditions other than malignant neoplasm: Secondary | ICD-10-CM | POA: Diagnosis not present

## 2023-01-14 DIAGNOSIS — K5730 Diverticulosis of large intestine without perforation or abscess without bleeding: Secondary | ICD-10-CM | POA: Diagnosis not present

## 2023-01-14 DIAGNOSIS — Z860100 Personal history of colon polyps, unspecified: Secondary | ICD-10-CM

## 2023-01-14 DIAGNOSIS — Z87442 Personal history of urinary calculi: Secondary | ICD-10-CM | POA: Insufficient documentation

## 2023-01-14 DIAGNOSIS — D126 Benign neoplasm of colon, unspecified: Secondary | ICD-10-CM

## 2023-01-14 DIAGNOSIS — F17210 Nicotine dependence, cigarettes, uncomplicated: Secondary | ICD-10-CM | POA: Diagnosis not present

## 2023-01-14 DIAGNOSIS — D128 Benign neoplasm of rectum: Secondary | ICD-10-CM | POA: Insufficient documentation

## 2023-01-14 HISTORY — PX: COLONOSCOPY WITH PROPOFOL: SHX5780

## 2023-01-14 HISTORY — PX: POLYPECTOMY: SHX5525

## 2023-01-14 SURGERY — COLONOSCOPY WITH PROPOFOL
Anesthesia: General

## 2023-01-14 MED ORDER — PROPOFOL 500 MG/50ML IV EMUL
INTRAVENOUS | Status: DC | PRN
  Administered 2023-01-14: 150 ug/kg/min via INTRAVENOUS

## 2023-01-14 MED ORDER — PHENYLEPHRINE 80 MCG/ML (10ML) SYRINGE FOR IV PUSH (FOR BLOOD PRESSURE SUPPORT)
PREFILLED_SYRINGE | INTRAVENOUS | Status: DC | PRN
  Administered 2023-01-14: 160 ug via INTRAVENOUS

## 2023-01-14 MED ORDER — LINACLOTIDE 72 MCG PO CAPS: 72.0000 ug | ORAL_CAPSULE | Freq: Every day | ORAL | 11 refills | Status: DC

## 2023-01-14 MED ORDER — STERILE WATER FOR IRRIGATION IR SOLN
Status: DC | PRN
  Administered 2023-01-14: 120 mL

## 2023-01-14 MED ORDER — PHENYLEPHRINE 80 MCG/ML (10ML) SYRINGE FOR IV PUSH (FOR BLOOD PRESSURE SUPPORT)
PREFILLED_SYRINGE | INTRAVENOUS | Status: AC
  Filled 2023-01-14: qty 10

## 2023-01-14 MED ORDER — SODIUM CHLORIDE 0.9% FLUSH: 10.0000 mL | Freq: Two times a day (BID) | INTRAVENOUS | Status: DC

## 2023-01-14 MED ORDER — PROPOFOL 10 MG/ML IV BOLUS
INTRAVENOUS | Status: DC | PRN
  Administered 2023-01-14: 80 mg via INTRAVENOUS
  Administered 2023-01-14: 30 mg via INTRAVENOUS

## 2023-01-14 MED ORDER — LIDOCAINE HCL (CARDIAC) PF 100 MG/5ML IV SOSY
PREFILLED_SYRINGE | INTRAVENOUS | Status: DC | PRN
  Administered 2023-01-14: 80 mg via INTRAVENOUS

## 2023-01-14 MED ORDER — LACTATED RINGERS IV SOLN: INTRAVENOUS | Status: DC | PRN

## 2023-01-14 NOTE — Discharge Instructions (Addendum)
  Colonoscopy Discharge Instructions  Read the instructions outlined below and refer to this sheet in the next few weeks. These discharge instructions provide you with general information on caring for yourself after you leave the hospital. Your doctor may also give you specific instructions. While your treatment has been planned according to the most current medical practices available, unavoidable complications occasionally occur. If you have any problems or questions after discharge, call Dr. Jena Gauss at [phone]. ACTIVITY You may resume your regular activity, but move at a slower pace for the next 24 hours.  Take frequent rest periods for the next 24 hours.  Walking will help get rid of the air and reduce the bloated feeling in your belly (abdomen).  No driving for 24 hours (because of the medicine (anesthesia) used during the test).   Do not sign any important legal documents or operate any machinery for 24 hours (because of the anesthesia used during the test).  NUTRITION Drink plenty of fluids.  You may resume your normal diet as instructed by your doctor.  Begin with a light meal and progress to your normal diet. Heavy or fried foods are harder to digest and may make you feel sick to your stomach (nauseated).  Avoid alcoholic beverages for 24 hours or as instructed.  MEDICATIONS You may resume your normal medications unless your doctor tells you otherwise.  WHAT YOU CAN EXPECT TODAY Some feelings of bloating in the abdomen.  Passage of more gas than usual.  Spotting of blood in your stool or on the toilet paper.  IF YOU HAD POLYPS REMOVED DURING THE COLONOSCOPY: No aspirin products for 7 days or as instructed.  No alcohol for 7 days or as instructed.  Eat a soft diet for the next 24 hours.  FINDING OUT THE RESULTS OF YOUR TEST Not all test results are available during your visit. If your test results are not back during the visit, make an appointment with your caregiver to find out the  results. Do not assume everything is normal if you have not heard from your caregiver or the medical facility. It is important for you to follow up on all of your test results.  SEEK IMMEDIATE MEDICAL ATTENTION IF: You have more than a spotting of blood in your stool.  Your belly is swollen (abdominal distention).  You are nauseated or vomiting.  You have a temperature over 101.  You have abdominal pain or discomfort that is severe or gets worse throughout the day.      5 polyps removed from your colon-all small  You have diverticulosis on the left side of your colon  Colon polyp and diverticulosis information provided   continue Linzess 145 daily   further recommendations to follow pending review of pathology report  At patient request, I called Wayne Peterson at [phone] -  reviewed findings and recommendations

## 2023-01-14 NOTE — Anesthesia Preprocedure Evaluation (Signed)
Anesthesia Evaluation  Patient identified by MRN, date of birth, ID band Patient awake    Reviewed: Allergy & Precautions, H&P , NPO status , Patient's Chart, lab work & pertinent test results, reviewed documented beta blocker date and time   Airway Mallampati: II  TM Distance: >3 FB Neck ROM: full    Dental no notable dental hx.    Pulmonary neg pulmonary ROS, Current Smoker and Patient abstained from smoking.   Pulmonary exam normal breath sounds clear to auscultation       Cardiovascular Exercise Tolerance: Good negative cardio ROS  Rhythm:regular Rate:Normal     Neuro/Psych negative neurological ROS  negative psych ROS   GI/Hepatic negative GI ROS, Neg liver ROS,,,  Endo/Other  negative endocrine ROS    Renal/GU negative Renal ROS  negative genitourinary   Musculoskeletal   Abdominal   Peds  Hematology negative hematology ROS (+)   Anesthesia Other Findings   Reproductive/Obstetrics negative OB ROS                             Anesthesia Physical Anesthesia Plan  ASA: 2  Anesthesia Plan: General   Post-op Pain Management:    Induction:   PONV Risk Score and Plan: Propofol infusion  Airway Management Planned:   Additional Equipment:   Intra-op Plan:   Post-operative Plan:   Informed Consent: I have reviewed the patients History and Physical, chart, labs and discussed the procedure including the risks, benefits and alternatives for the proposed anesthesia with the patient or authorized representative who has indicated his/her understanding and acceptance.     Dental Advisory Given  Plan Discussed with: CRNA  Anesthesia Plan Comments:        Anesthesia Quick Evaluation  

## 2023-01-14 NOTE — Telephone Encounter (Signed)
-----   Message from Eula Listen sent at 01/14/2023 11:01 AM EST -----   Linzess 72 working well.  He states he is on the 72.  Lets call in a prescription for Linzess 72 1 capsule daily.  Dispense 30 with 11 refills.

## 2023-01-14 NOTE — Transfer of Care (Addendum)
Immediate Anesthesia Transfer of Care Note  Patient: Wayne Peterson  Procedure(s) Performed: COLONOSCOPY WITH PROPOFOL POLYPECTOMY  Patient Location: Endoscopy Unit  Anesthesia Type:General  Level of Consciousness: drowsy and patient cooperative  Airway & Oxygen Therapy: Patient Spontanous Breathing and Patient connected to nasal cannula oxygen  Post-op Assessment: Report given to RN and Post -op Vital signs reviewed and stable  Post vital signs: Reviewed and stable  Last Vitals:  Vitals Value Taken Time  BP 91/46 01/14/23   1042  Temp 36.9 01/14/23   1042  Pulse 61 01/14/23   1042  Resp 18 01/14/23   1042  SpO2 95% 01/14/23   1042    Last Pain:  Vitals:   01/14/23 0949  TempSrc: Oral  PainSc: 0-No pain      Patients Stated Pain Goal: 8 (01/14/23 0949)  Complications: No notable events documented.

## 2023-01-14 NOTE — Interval H&P Note (Signed)
History and Physical Interval Note:  01/14/2023 9:59 AM  Wayne Peterson  has presented today for surgery, with the diagnosis of history of polyps.  The various methods of treatment have been discussed with the patient and family. After consideration of risks, benefits and other options for treatment, the patient has consented to  Procedure(s) with comments: COLONOSCOPY WITH PROPOFOL (N/A) - 9:00 am, asa 2 as a surgical intervention.  The patient's history has been reviewed, patient examined, no change in status, stable for surgery.  I have reviewed the patient's chart and labs.  Questions were answered to the patient's satisfaction.     Kavion Mancinas   No change.  Colonoscopy today per plan.  The risks, benefits, limitations, alternatives and imponderables have been reviewed with the patient. Questions have been answered. All parties are agreeable.

## 2023-01-14 NOTE — Op Note (Signed)
[facility] Patient Name: Wayne Peterson Procedure Date: 01/14/2023 9:40 AM MRN: [id number] Date of Birth: 01/27/1976 Attending MD: Gennette Pac , MD, [id number] CSN: [id number] Age: 47 Admit Type: Outpatient Procedure:                Colonoscopy Indications:              High risk colon cancer surveillance: Personal                            history of colonic polyps Providers:                Gennette Pac, MD, Francoise Ceo RN, RN, Dyann Ruddle Referring MD:              Medicines:                Propofol per Anesthesia Complications:            No immediate complications. Estimated Blood Loss:     Estimated blood loss was minimal. Procedure:                Pre-Anesthesia Assessment:                           - Prior to the procedure, a History and Physical                            was performed, and patient medications and                            allergies were reviewed. The patient's tolerance of                            previous anesthesia was also reviewed. The risks                            and benefits of the procedure and the sedation                            options and risks were discussed with the patient.                            All questions were answered, and informed consent                            was obtained. Prior Anticoagulants: The patient has                            taken no anticoagulant or antiplatelet agents. ASA                            Grade Assessment: II - A patient with mild systemic  disease. After reviewing the risks and benefits,                            the patient was deemed in satisfactory condition to                            undergo the procedure.                           After obtaining informed consent, the colonoscope                            was passed under direct vision. Throughout the                            procedure, the patient's  blood pressure, pulse, and                            oxygen saturations were monitored continuously. The                            [id number]) scope was introduced through the                            anus and advanced to the the cecum, identified by                            appendiceal orifice and ileocecal valve. The                            colonoscopy was performed without difficulty. The                            patient tolerated the procedure well. The quality                            of the bowel preparation was adequate. The                            ileocecal valve, appendiceal orifice, and rectum                            were photographed. The entire colon was well                            visualized. Scope In: 10:20:21 AM Scope Out: 10:35:45 AM Scope Withdrawal Time: 0 hours 12 minutes 49 seconds  Total Procedure Duration: 0 hours 15 minutes 24 seconds  Findings:      The perianal and digital rectal examinations were normal.      Scattered medium-mouthed diverticula were found in the sigmoid colon and       descending colon.      Five sessile polyps were found in the rectum, proximal rectum,       descending colon and hepatic flexure. The polyps were 4 to  5 mm in size.       These polyps were removed with a cold snare. Resection and retrieval       were complete. Estimated blood loss was minimal. Polyps submitted       separately by region.      The exam was otherwise without abnormality on direct and retroflexion       views. Impression:               - Diverticulosis in the sigmoid colon and in the                            descending colon.                           - Five 4 to 5 mm polyps in the rectum, in the                            proximal rectum, in the descending colon and at the                            hepatic flexure, removed with a cold snare.                            Resected and retrieved.                           - The  examination was otherwise normal on direct                            and retroflexion views.                           -Patient's notes bowel function much better on                            Linzess 145. Ischial pain is resolved. Moderate Sedation:      Moderate (conscious) sedation was personally administered by an       anesthesia professional. The following parameters were monitored: oxygen       saturation, heart rate, blood pressure, respiratory rate, EKG, adequacy       of pulmonary ventilation, and response to care. Recommendation:           - Patient has a contact number available for                            emergencies. The signs and symptoms of potential                            delayed complications were discussed with the                            patient. Return to normal activities tomorrow.                            Written discharge instructions were provided to the  patient.                           - Advance diet as tolerated.                           - Continue present medications. Continue Linzess                            145 daily.                           - Repeat colonoscopy date to be determined after                            pending pathology results are reviewed for                            surveillance.                           - Return to GI office in 3 months. Procedure Code(s):        --- Professional ---                           [phone], Colonoscopy, flexible; with removal of                            tumor(s), polyp(s), or other lesion(s) by snare                            technique Diagnosis Code(s):        --- Professional ---                           Z86.010, Personal history of colonic polyps                           D12.8, Benign neoplasm of rectum                           D12.4, Benign neoplasm of descending colon                           D12.3, Benign neoplasm of transverse colon (hepatic                             flexure or splenic flexure)                           K57.30, Diverticulosis of large intestine without                            perforation or abscess without bleeding CPT copyright 2022 [employer] Association. All rights reserved. The codes documented in this report are preliminary and upon coder review may  be revised to meet current compliance requirements. Gerrit Friends. Jena Gauss, MD Rosita Fire  Allison Silva, MD 01/14/2023 10:48:18 AM This report has been signed electronically. Number of Addenda: 0

## 2023-01-15 LAB — SURGICAL PATHOLOGY

## 2023-01-16 NOTE — Anesthesia Postprocedure Evaluation (Signed)
Anesthesia Post Note  Patient: Wayne Peterson  Procedure(s) Performed: COLONOSCOPY WITH PROPOFOL POLYPECTOMY  Patient location during evaluation: [name] Anesthesia Type: General Level of consciousness: awake Pain management: pain level controlled Vital Signs Assessment: post-procedure vital signs reviewed and stable Respiratory status: spontaneous breathing and respiratory function stable Cardiovascular status: blood pressure returned to baseline and stable Postop Assessment: no headache and no apparent nausea or vomiting Anesthetic complications: no Comments: Late entry   No notable events documented.   Last Vitals:  Vitals:   01/14/23 0949 01/14/23 1042  BP: 121/79 (!) 91/46  Pulse: 84 63  Resp: 18 18  Temp: 36.8 C 36.9 C  SpO2: 96% 95%    Last Pain:  Vitals:   01/14/23 1044  TempSrc:   PainSc: 0-No pain                 Windell Norfolk

## 2023-01-18 ENCOUNTER — Encounter: Payer: Self-pay | Admitting: Internal Medicine

## 2023-01-20 ENCOUNTER — Telehealth: Payer: Self-pay

## 2023-01-20 ENCOUNTER — Other Ambulatory Visit: Payer: Self-pay

## 2023-01-20 MED ORDER — TRULANCE 3 MG PO TABS: 3.0000 mg | ORAL_TABLET | Freq: Every day | ORAL | 11 refills | Status: DC

## 2023-01-20 NOTE — Telephone Encounter (Signed)
Rx has been sent to pharmacy

## 2023-01-20 NOTE — Telephone Encounter (Signed)
Pt's insurance company doe not pay for Linzess. Must try either Lubiprostone, Movantik or Trulance.

## 2023-01-21 ENCOUNTER — Encounter (HOSPITAL_COMMUNITY): Payer: Self-pay | Admitting: Internal Medicine

## 2023-03-26 DIAGNOSIS — N200 Calculus of kidney: Secondary | ICD-10-CM | POA: Insufficient documentation

## 2023-03-26 NOTE — Progress Notes (Unsigned)
Name: Wayne Peterson DOB: January 11, 1976 MRN: [id number]  History of Present Illness: Wayne Peterson is a 48 y.o. male who presents today as a new patient at [facility] [place]. All available relevant medical records have been reviewed.  - GU History: 1. Kidney stone(s). - 08/26/2018: Underwent left ureteroscopic stone manipulation by Dr. Berneice Heinrich. - 04/19/2019: CT showed a non-obstructing 4 mm left kidney stone.  Today: He reports intermittent left flank / left low back pain for the past 6 months. He states the pain is dull most of the time however he did have one episode last week when the pain was severe for about 20-30 minutes. Over the past 6 months he has also been having some defecatory dysfunction for which he has been evaluated by GI including a colonoscopy in October 2024 which he reports was unremarkable. Denies abdominal pain or known passage of stones recently.  He denies increased urinary urgency, frequency, nocturia, dysuria, gross hematuria, hesitancy, straining to void, or sensations of incomplete emptying.   Fall Screening: Do you usually have a device to assist in your mobility? No   Medications: Current Outpatient Medications  Medication Sig Dispense Refill   Cholecalciferol (VITAMIN D3) 50 MCG (2000 UT) TABS Take 4,000 mg by mouth at bedtime.     Multiple Vitamin (MULTIVITAMIN WITH MINERALS) TABS tablet Take 1 tablet by mouth at bedtime.     Probiotic Product (PROBIOTIC PO) Take 1 capsule by mouth at bedtime.     Plecanatide (TRULANCE) 3 MG TABS Take 1 tablet (3 mg total) by mouth daily. (Patient not taking: Reported on 03/31/2023) 30 tablet 11   tamsulosin (FLOMAX) 0.4 MG CAPS capsule Take 1 capsule (0.4 mg total) by mouth daily for 14 days. 14 capsule 0   No current facility-administered medications for this visit.    Allergies: Allergies  Allergen Reactions   Penicillins Nausea And Vomiting    Did it involve swelling of the face/tongue/throat, SOB, or  low BP? No Did it involve sudden or severe rash/hives, skin peeling, or any reaction on the inside of your mouth or nose? No Did you need to seek medical attention at a hospital or doctor's office? No When did it last happen? Teenager If all above answers are "NO", may proceed with cephalosporin use.     Past Medical History:  Diagnosis Date   Colon polyps 06/16/2019   Nine 5-7 mm polyps, two 18-22 mm polyps, pathology with multiple tubular adenomas without high-grade dysplasia, also hyperplastic rectal polyps   Diverticulitis    History of kidney stones    Past Surgical History:  Procedure Laterality Date   COLONOSCOPY N/A 06/16/2019   Procedure: COLONOSCOPY;  Surgeon: Corbin Ade, MD; diverticulosis in sigmoid and descending colon, nine 5-7 mm polyps, two 18-22 mm polyps resected in piecemeal fashion s/p clip placement.  Pathology with multiple tubular adenomas without high-grade dysplasia as well as hyperplastic rectal polyps.  Repeat colonoscopy in 6 months.   COLONOSCOPY N/A 03/20/2020   Procedure: COLONOSCOPY;  Surgeon: Corbin Ade, MD;  [facility];  Service: Endoscopy;  Laterality: N/A;  12:00   COLONOSCOPY WITH PROPOFOL N/A 01/14/2023   Procedure: COLONOSCOPY WITH PROPOFOL;  Surgeon: Corbin Ade, MD;  [facility];  Service: Endoscopy;  Laterality: N/A;  9:00 am, asa 2   CYSTOSCOPY WITH RETROGRADE PYELOGRAM, URETEROSCOPY AND STENT PLACEMENT  08/26/2018   Procedure: CYSTOSCOPY WITH BILATERAL RETROGRADE PYELOGRAM, LEFT URETEROSCOPY AND LEFT STENT PLACEMENT;  Surgeon: Sebastian Ache,  MD;  Location: [facility];  Service: Urology;;   HOLMIUM LASER APPLICATION Left 08/26/2018   Procedure: HOLMIUM LASER APPLICATION;  Surgeon: Sebastian Ache, MD;  Location: [facility];  Service: Urology;  Laterality: Left;   POLYPECTOMY  03/20/2020   Procedure: POLYPECTOMY INTESTINAL;  Surgeon: Corbin Ade, MD;  [facility];   Service: Endoscopy;;   POLYPECTOMY  01/14/2023   Procedure: POLYPECTOMY;  Surgeon: Corbin Ade, MD;  [facility];  Service: Endoscopy;;   WISDOM TOOTH EXTRACTION     Family History  Problem Relation Age of Onset   Cancer Mother    Cirrhosis Father    Colon polyps Maternal Grandfather    Colon cancer Neg Hx    Social History   Socioeconomic History   Marital status: Married    Spouse name: Not on file   Number of children: Not on file   Years of education: Not on file   Highest education level: Not on file  Occupational History   Not on file  Tobacco Use   Smoking status: Every Day    Current packs/day: 0.50    Average packs/day: 0.5 packs/day for 23.0 years (11.5 ttl pk-yrs)    Types: Cigarettes   Smokeless tobacco: Never  Vaping Use   Vaping status: Never Used  Substance and Sexual Activity   Alcohol use: Not Currently   Drug use: Yes    Types: Marijuana    Comment: marijuana "hemp" over the weekend   Sexual activity: Not Currently  Other Topics Concern   Not on file  Social History Narrative   Not on file   Social Drivers of Health   Financial Resource Strain: Not on file  Food Insecurity: Not on file  Transportation Needs: Not on file  Physical Activity: Not on file  Stress: Not on file  Social Connections: Not on file  Intimate Partner Violence: Not on file    SUBJECTIVE  Review of Systems Constitutional: Patient denies any unintentional weight loss or change in strength lntegumentary: Patient denies any rashes or pruritus Cardiovascular: Patient denies chest pain or syncope Respiratory: Patient denies shortness of breath Gastrointestinal: Patient denies nausea, vomiting, constipation, or diarrhea Musculoskeletal: Patient denies muscle cramps or weakness Neurologic: Patient denies convulsions or seizures Allergic/Immunologic: Patient denies recent allergic reaction(s) Hematologic/Lymphatic: Patient denies bleeding  tendencies Endocrine: Patient denies heat/cold intolerance  GU: As per HPI.  OBJECTIVE Vitals:   03/31/23 0842  BP: 113/71  Pulse: 77  Temp: 98.3 F (36.8 C)   There is no height or weight on file to calculate BMI.  Physical Examination Constitutional: No obvious distress; patient is non-toxic appearing  Cardiovascular: No visible lower extremity edema.  Respiratory: The patient does not have audible wheezing/stridor; respirations do not appear labored  Gastrointestinal: Abdomen non-distended Musculoskeletal: Normal ROM of UEs  Skin: No obvious rashes/open sores  Neurologic: CN 2-12 grossly intact Psychiatric: Answered questions appropriately with normal affect  Hematologic/Lymphatic/Immunologic: No obvious bruises or sites of spontaneous bleeding  Urine microscopy: 6-10 WBC/hpf, >30 RBC/hpf, few bacteria, calcium oxalate crystals present   ASSESSMENT Kidney stones - Plan: Urinalysis, Routine w reflex microscopic, DG Abd 1 View, US RENAL, tamsulosin (FLOMAX) 0.4 MG CAPS capsule  Left flank pain, chronic - Plan: DG Abd 1 View, US RENAL  Chronic left-sided low back pain without sciatica - Plan: DG Abd 1 View, US RENAL  Calcium oxalate crystals in urine  Advised RUS and KUB to evaluate current stone burden.  Discussed use of Flomax 0.4 mg daily for medical expulsive therapy, which may improve passage of stone(s). Will notify patient of recommendations following review of imaging results.  Advised adequate hydration and we discussed option to consider low oxalate diet given that calcium oxalate is the most common type of stone. Handout provided about stone prevention diet.  Will plan to follow up in 2 weeks. He verbalized understanding and agreement. All questions were answered.   PLAN Advised the following: RUS & KUB.  Flomax daily x2 weeks. Return in about 2 weeks (around 04/14/2023) for f/u with Evette Georges, NP.  Orders Placed This Encounter  Procedures   DG Abd 1  View    Standing Status:   Future    Expected Date:   03/31/2023    Expiration Date:   03/30/2024    Reason for Exam (SYMPTOM  OR DIAGNOSIS REQUIRED):   kidney stone    Preferred imaging location?:   [facility]   US RENAL    Standing Status:   Future    Expected Date:   03/31/2023    Expiration Date:   03/30/2024    Reason for Exam (SYMPTOM  OR DIAGNOSIS REQUIRED):   kidney stone known or suspected    Preferred imaging location?:   [facility]   Urinalysis, Routine w reflex microscopic    It has been explained that the patient is to follow regularly with their PCP in addition to all other providers involved in their care and to follow instructions provided by these respective offices. Patient advised to contact urology clinic if any urologic-pertaining questions, concerns, new symptoms or problems arise in the interim period.  Patient Instructions  >80% of stones are calcium oxalate. This type of stones forms when body either isn't clearing oxalate well enough, is making too much oxalate, or too little citrate. This results in oxalate binding to form crystals, which continue to aggregate and form stones.  Limiting calcium does not help, but limiting oxalate in the diet can help. Increasing citric acid intake may also help.  The following measures may help to prevent the recurrence of stones: Increase water intake to 2-2.5 liters per day May add citrus juice (lemon, lime or orange juice) to water Moderation in dairy foods Decrease in salt content 5. Low Oxalate diet: Oxylates are found in foods like Tomato, Spinach, red wine and chocolate (see additional resources below).  Internet resources for information regarding low oxalate diet: https://kidneystones.yangchunwu.com https://my.VerticalStretch.be  Foods Low in Sodium or Oxalate Foods You Can Eat  Drinks Coffee, fruit and veggie  juice (using the recommended veggies), fruit punch  Fruits Apples, apricots (fresh or canned), avocado, bananas, cherries (sweet), cranberries, grapefruit, red or green grapes, lemon and lime juice, melons, nectarines, papayas, peaches, pears, pineapples, oranges, strawberries (fresh), tangerines  Veggies Artichokes, asparagus, bamboo shoots, broccoli, brussels sprouts, cabbage, cauliflower, chayote squash, chicory, corn, cucumbers, endive, lettuce, lima beans, mushrooms, onions, peas, peppers, potatoes, radishes, rutabagas, zucchini  Breads, Cereals, Grains Egg noodles, rye bread, cooked and dry cereals without nuts or bran, crackers with unsalted tops, white or wild rice  Meat, Meat Replacements, Fish, Recruitment consultant, fish, poultry, eggs, egg whites, egg replacements  Soup Homemade soup (using the recommended veggies and meat), low-sodium bouillon, low-sodium canned  Desserts Cookies, cakes, ice cream, pudding without chocolate or nuts, candy without chocolate or nuts  Fats and Oils Butter, margarine, cream, oil, salad dressing, mayo  Other Foods Unsalted potato chips or pretzels, herbs (like garlic, garlic powder,  onion powder), lemon juice, salt-free seasoning blends, vinegar  Other Foods Low in Oxalate Foods You Can Eat  Drinks Beer, cola, wine, buttermilk, lemonade or limeade (without added vitamin C), milk  Meat, Meat Replacements, Fish, [employer] meat, ham, bacon, hot dogs, bratwurst, sausage, chicken nuggets, cheddar cheese, canned fish and shellfish  Soup Tomato soup, cheese soup  Other Foods Coconuts, lemon or lime juices, sugar or sweeteners, jellies or jams (from the recommended list)   Moderate-Oxalate Foods Foods to Limit   Drinks Fruit and veggie juices (from the list below), chocolate milk, rice milk, hot cocoa, tea   Fruits Blackberries, blueberries, black currants, cherries (sour), fruit cocktail, mangoes, orange peel, prunes, purple plums   Veggies Baked beans, carrots,  celery, green beans, parsnips, summer squash, tomatoes, turnips   Breads, Cereals, Grains White bread, cornbread or cornmeal, white English muffins, saltine or soda crackers, brown rice, vanilla wafers, spaghetti and other noodles, firm tofu, bagels, oatmeal   Meat/meat replacements, fish, poultry Sardines   Desserts Chocolate cake   Fats and Oils Macadamia nuts, pistachio nuts, English walnuts   Other Foods Jams or jellies (made with the fruits above), pepper    High-Oxalate Foods Foods to Avoid Drinks Chocolate drink mixes, soy milk, Ovaltine, instant iced tea, fruit juices of fruits listed below Fruits Apricots (dried), red currants, figs, kiwi, plums, rhubarb Veggies Beans (wax, dried), beets and beet greens, chives, collard greens, eggplant, escarole, dark greens of all kinds, leeks, okra, parsley, rutabagas, spinach, Swiss chard, tomato paste, watercress Breads, Cereals, Grains Amaranth, barley, white corn flour, fried potatoes, fruitcake, grits, soybean products, sweet potatoes, wheat germ and bran, buckwheat flour, All Bran cereal, graham crackers, pretzels, whole wheat bread Meat/meat replacements, fish, poultry  Dried beans, peanut butter, soy burgers, miso  Desserts Carob, chocolate, marmalades Fats and Oils Nuts (peanuts, almonds, pecans, cashews, hazelnuts), nut butters, sesame seeds, tahini paste Other Foods Poppy seeds   Electronically signed by: Donnita Falls, MSN, FNP-C, CUNP 03/31/2023 9:19 AM

## 2023-03-31 ENCOUNTER — Ambulatory Visit: Payer: 59 | Admitting: Urology

## 2023-03-31 ENCOUNTER — Encounter: Payer: Self-pay | Admitting: Urology

## 2023-03-31 ENCOUNTER — Telehealth: Payer: Self-pay

## 2023-03-31 VITALS — BP 113/71 | HR 77 | Temp 98.3°F

## 2023-03-31 DIAGNOSIS — N200 Calculus of kidney: Secondary | ICD-10-CM

## 2023-03-31 DIAGNOSIS — G8929 Other chronic pain: Secondary | ICD-10-CM

## 2023-03-31 DIAGNOSIS — R82998 Other abnormal findings in urine: Secondary | ICD-10-CM | POA: Diagnosis not present

## 2023-03-31 LAB — URINALYSIS, ROUTINE W REFLEX MICROSCOPIC
Bilirubin, UA: NEGATIVE
Glucose, UA: NEGATIVE
Ketones, UA: NEGATIVE
Nitrite, UA: NEGATIVE
Specific Gravity, UA: 1.03 (ref 1.005–1.030)
Urobilinogen, Ur: 0.2 mg/dL (ref 0.2–1.0)
pH, UA: 5.5 (ref 5.0–7.5)

## 2023-03-31 LAB — MICROSCOPIC EXAMINATION: RBC, Urine: >30 /[HPF] — AB (ref 0–2)

## 2023-03-31 MED ORDER — TAMSULOSIN HCL 0.4 MG PO CAPS: 0.4000 mg | ORAL_CAPSULE | Freq: Every day | ORAL | 0 refills | Status: AC

## 2023-03-31 NOTE — Patient Instructions (Signed)

## 2023-03-31 NOTE — Telephone Encounter (Signed)
-----   Message from Donnita Falls sent at 03/31/2023  9:15 AM EST ----- Please let pt know that his UA was reviewed by provider after today's appt. UA suggestive of stone passage as he suspects, therefore I am going to go ahead and send in Flomax 0.4 mg daily for medical expulsive therapy today. He is advised to proceed with RUS & KUB this week as discussed and I have asked Nelva Bush to schedule f/u with me in 2 weeks. Thanks!

## 2023-03-31 NOTE — Telephone Encounter (Signed)
Called Pt to relay message from NP Pt wanted know was it potential passage from kidney to bladder and is the suspected kidney stone why there's a KUB and RUS needed I said yes he understood and said he'd pick up that Rx

## 2023-04-06 ENCOUNTER — Ambulatory Visit (HOSPITAL_COMMUNITY)
Admission: RE | Admit: 2023-04-06 | Discharge: 2023-04-06 | Disposition: A | Discharge status: Home / Self Care | Payer: 59 | Source: Ambulatory Visit | Attending: Urology | Admitting: Urology

## 2023-04-06 ENCOUNTER — Other Ambulatory Visit: Payer: Self-pay | Admitting: Urology

## 2023-04-06 DIAGNOSIS — R93429 Abnormal radiologic findings on diagnostic imaging of unspecified kidney: Secondary | ICD-10-CM

## 2023-04-06 DIAGNOSIS — M545 Low back pain, unspecified: Secondary | ICD-10-CM

## 2023-04-06 DIAGNOSIS — N200 Calculus of kidney: Secondary | ICD-10-CM | POA: Diagnosis present

## 2023-04-06 DIAGNOSIS — R109 Unspecified abdominal pain: Secondary | ICD-10-CM | POA: Diagnosis present

## 2023-04-06 DIAGNOSIS — G8929 Other chronic pain: Secondary | ICD-10-CM | POA: Diagnosis present

## 2023-04-06 DIAGNOSIS — R82998 Other abnormal findings in urine: Secondary | ICD-10-CM

## 2023-04-14 ENCOUNTER — Ambulatory Visit: Payer: 59 | Admitting: Urology

## 2023-04-15 ENCOUNTER — Ambulatory Visit (HOSPITAL_COMMUNITY)
Admission: RE | Admit: 2023-04-15 | Discharge: 2023-04-15 | Disposition: A | Discharge status: Home / Self Care | Payer: 59 | Source: Ambulatory Visit | Attending: Urology | Admitting: Urology

## 2023-04-15 DIAGNOSIS — G8929 Other chronic pain: Secondary | ICD-10-CM | POA: Insufficient documentation

## 2023-04-15 DIAGNOSIS — R109 Unspecified abdominal pain: Secondary | ICD-10-CM | POA: Diagnosis present

## 2023-04-15 DIAGNOSIS — N200 Calculus of kidney: Secondary | ICD-10-CM | POA: Insufficient documentation

## 2023-04-15 DIAGNOSIS — R93429 Abnormal radiologic findings on diagnostic imaging of unspecified kidney: Secondary | ICD-10-CM | POA: Diagnosis present

## 2023-04-15 DIAGNOSIS — M545 Low back pain, unspecified: Secondary | ICD-10-CM | POA: Insufficient documentation

## 2023-04-15 DIAGNOSIS — R82998 Other abnormal findings in urine: Secondary | ICD-10-CM | POA: Diagnosis present

## 2023-04-21 ENCOUNTER — Other Ambulatory Visit: Payer: Self-pay | Admitting: Urology

## 2023-04-21 DIAGNOSIS — N130 Hydronephrosis with ureteropelvic junction obstruction: Secondary | ICD-10-CM

## 2023-04-21 DIAGNOSIS — N132 Hydronephrosis with renal and ureteral calculous obstruction: Secondary | ICD-10-CM

## 2023-04-21 DIAGNOSIS — N135 Crossing vessel and stricture of ureter without hydronephrosis: Secondary | ICD-10-CM

## 2023-04-21 DIAGNOSIS — N200 Calculus of kidney: Secondary | ICD-10-CM

## 2023-04-21 NOTE — Progress Notes (Deleted)
 Name: Wayne Peterson DOB: Nov 15, 1975 MRN: [id number]  History of Present Illness: Mr. Wayne Peterson is a 48 y.o. male who presents today for follow up visit at [facility] [place]. ***He is accompanied by ***. - GU History: 1. Kidney stones. - 08/26/2018: Underwent left ureteroscopic stone manipulation by Dr. Berneice Heinrich. - 04/19/2019: CT showed a non-obstructing 4 mm left kidney stone.   At initial visit on 04/06/2023: - Reported intermittent left flank / left low back pain x6 months; typically dull, one recent episode of severe pain. No LUTS.  - Urine microscopy: 6-10 WBC/hpf, >30 RBC/hpf, few bacteria, calcium oxalate crystals present. - Renal ultrasound indeterminate: possible 2 cm left intrarenal stone. CT advised.   Since last visit: > 04/15/2023: CT stone study performed. Dr. Ronne Binning reviewed the CT images and determined patient has bilateral UPJ obstruction with bilateral hydronephrosis along with 3 large left UPJ stones measuring 9 cm, 12 cm, and 7 cm respectively. He advised Lasix renogram to assess patient's renal function to assist in determining optimal surgical approach.   > ***/2025: Lasix renogram showed ***  Today: He {Actions; denies-reports:120008} recent stone passage. He {Actions; denies-reports:120008} flank pain or abdominal pain. He {Actions; denies-reports:120008} fevers, nausea, or vomiting.  He {Actions; denies-reports:120008} increased urinary urgency, frequency, nocturia, dysuria, gross hematuria, hesitancy, straining to void, or sensations of incomplete emptying.   Fall Screening: Do you usually have a device to assist in your mobility? {yes/no:20286}  ***cane / ***walker / ***wheelchair  Medications: Current Outpatient Medications  Medication Sig Dispense Refill   Cholecalciferol (VITAMIN D3) 50 MCG (2000 UT) TABS Take 4,000 mg by mouth at bedtime.     Multiple Vitamin (MULTIVITAMIN WITH MINERALS) TABS tablet Take 1 tablet by mouth at bedtime.      Plecanatide (TRULANCE) 3 MG TABS Take 1 tablet (3 mg total) by mouth daily. (Patient not taking: Reported on 03/31/2023) 30 tablet 11   Probiotic Product (PROBIOTIC PO) Take 1 capsule by mouth at bedtime.     No current facility-administered medications for this visit.    Allergies: Allergies  Allergen Reactions   Penicillins Nausea And Vomiting    Did it involve swelling of the face/tongue/throat, SOB, or low BP? No Did it involve sudden or severe rash/hives, skin peeling, or any reaction on the inside of your mouth or nose? No Did you need to seek medical attention at a hospital or doctor's office? No When did it last happen? Teenager If all above answers are "NO", may proceed with cephalosporin use.     Past Medical History:  Diagnosis Date   Colon polyps 06/16/2019   Nine 5-7 mm polyps, two 18-22 mm polyps, pathology with multiple tubular adenomas without high-grade dysplasia, also hyperplastic rectal polyps   Diverticulitis    History of kidney stones    Past Surgical History:  Procedure Laterality Date   COLONOSCOPY N/A 06/16/2019   Procedure: COLONOSCOPY;  Surgeon: Corbin Ade, MD; diverticulosis in sigmoid and descending colon, nine 5-7 mm polyps, two 18-22 mm polyps resected in piecemeal fashion s/p clip placement.  Pathology with multiple tubular adenomas without high-grade dysplasia as well as hyperplastic rectal polyps.  Repeat colonoscopy in 6 months.   COLONOSCOPY N/A 03/20/2020   Procedure: COLONOSCOPY;  Surgeon: Corbin Ade, MD;  [facility];  Service: Endoscopy;  Laterality: N/A;  12:00   COLONOSCOPY WITH PROPOFOL N/A 01/14/2023   Procedure: COLONOSCOPY WITH PROPOFOL;  Surgeon: Corbin Ade, MD;  [facility];  Service:  Endoscopy;  Laterality: N/A;  9:00 am, asa 2   CYSTOSCOPY WITH RETROGRADE PYELOGRAM, URETEROSCOPY AND STENT PLACEMENT  08/26/2018   Procedure: CYSTOSCOPY WITH BILATERAL RETROGRADE PYELOGRAM, LEFT URETEROSCOPY AND LEFT  STENT PLACEMENT;  Surgeon: Sebastian Ache, MD;  Location: [facility];  Service: Urology;;   HOLMIUM LASER APPLICATION Left 08/26/2018   Procedure: HOLMIUM LASER APPLICATION;  Surgeon: Sebastian Ache, MD;  Location: [facility];  Service: Urology;  Laterality: Left;   POLYPECTOMY  03/20/2020   Procedure: POLYPECTOMY INTESTINAL;  Surgeon: Corbin Ade, MD;  [facility];  Service: Endoscopy;;   POLYPECTOMY  01/14/2023   Procedure: POLYPECTOMY;  Surgeon: Corbin Ade, MD;  [facility];  Service: Endoscopy;;   WISDOM TOOTH EXTRACTION     Family History  Problem Relation Age of Onset   Cancer Mother    Cirrhosis Father    Colon polyps Maternal Grandfather    Colon cancer Neg Hx    Social History   Socioeconomic History   Marital status: Married    Spouse name: Not on file   Number of children: Not on file   Years of education: Not on file   Highest education level: Not on file  Occupational History   Not on file  Tobacco Use   Smoking status: Every Day    Current packs/day: 0.50    Average packs/day: 0.5 packs/day for 23.0 years (11.5 ttl pk-yrs)    Types: Cigarettes   Smokeless tobacco: Never  Vaping Use   Vaping status: Never Used  Substance and Sexual Activity   Alcohol use: Not Currently   Drug use: Yes    Types: Marijuana    Comment: marijuana "hemp" over the weekend   Sexual activity: Not Currently  Other Topics Concern   Not on file  Social History Narrative   Not on file   Social Drivers of Health   Financial Resource Strain: Not on file  Food Insecurity: Not on file  Transportation Needs: Not on file  Physical Activity: Not on file  Stress: Not on file  Social Connections: Not on file  Intimate Partner Violence: Not on file    SUBJECTIVE  Review of Systems Constitutional: Patient denies any unintentional weight loss or change in strength lntegumentary: Patient denies any rashes or  pruritus Cardiovascular: Patient denies chest pain or syncope Respiratory: Patient denies shortness of breath Gastrointestinal: Patient ***denies nausea, vomiting, constipation, or diarrhea Musculoskeletal: Patient denies muscle cramps or weakness Neurologic: Patient denies convulsions or seizures Allergic/Immunologic: Patient denies recent allergic reaction(s) Hematologic/Lymphatic: Patient denies bleeding tendencies Endocrine: Patient denies heat/cold intolerance  GU: As per HPI.  OBJECTIVE There were no vitals filed for this visit. There is no height or weight on file to calculate BMI.  Physical Examination Constitutional: No obvious distress; patient is non-toxic appearing  Cardiovascular: No visible lower extremity edema.  Respiratory: The patient does not have audible wheezing/stridor; respirations do not appear labored  Gastrointestinal: Abdomen non-distended Musculoskeletal: Normal ROM of UEs  Skin: No obvious rashes/open sores  Neurologic: CN 2-12 grossly intact Psychiatric: Answered questions appropriately with normal affect  Hematologic/Lymphatic/Immunologic: No obvious bruises or sites of spontaneous bleeding  UA:  ***positive for *** leukocytes, *** blood, ***nitrites ***Urine microscopy:  ***negative  *** WBC/hpf, *** RBC/hpf, *** bacteria ***with no evidence of UTI ***with no evidence of microscopic hematuria ***otherwise unremarkable ***glucosuria (secondary to ***Jardiance ***Farxiga use)  PVR: *** ml  ASSESSMENT No diagnosis found.  ***We reviewed recent imaging results; ***  awaiting radiology results, appears to have ***no acute findings per provider interpretation.  ***For stone prevention: Advised adequate hydration and we discussed option to consider low oxalate diet given that calcium oxalate is the most common type of stone. Handout provided about stone prevention diet.  ***For recurrent stone formers: We discussed option to proceed with 24 hour  urinalysis ([facility]) for metabolic stone evaluation, which may help with targeted recommendations for dietary I medication therapies for stone prevention. Patient elected to ***proceed/ ***hold off.  Will plan to follow up in ***6 months / ***1 year with ***KUB ***RUS for stone surveillance or sooner if needed.  Patient verbalized understanding of and agreement with current plan. All questions were answered.  PLAN Advised the following: Maintain adequate fluid intake daily. Drink citrus juice (lemon, lime or orange juice) routinely. Low oxalate diet. No follow-ups on file.  No orders of the defined types were placed in this encounter.   It has been explained that the patient is to follow regularly with their PCP in addition to all other providers involved in their care and to follow instructions provided by these respective offices. Patient advised to contact urology clinic if any urologic-pertaining questions, concerns, new symptoms or problems arise in the interim period.  There are no Patient Instructions on file for this visit.  Electronically signed by:  Donnita Falls, MSN, FNP-C, CUNP 04/21/2023 1:58 PM

## 2023-04-26 ENCOUNTER — Ambulatory Visit: Payer: 59 | Admitting: Urology

## 2023-04-29 ENCOUNTER — Encounter (HOSPITAL_COMMUNITY)
Admission: RE | Admit: 2023-04-29 | Discharge: 2023-04-29 | Disposition: A | Discharge status: Home / Self Care | Payer: 59 | Source: Ambulatory Visit | Attending: Urology | Admitting: Urology

## 2023-04-29 DIAGNOSIS — N200 Calculus of kidney: Secondary | ICD-10-CM | POA: Diagnosis present

## 2023-04-29 DIAGNOSIS — N135 Crossing vessel and stricture of ureter without hydronephrosis: Secondary | ICD-10-CM | POA: Insufficient documentation

## 2023-04-29 DIAGNOSIS — N130 Hydronephrosis with ureteropelvic junction obstruction: Secondary | ICD-10-CM | POA: Insufficient documentation

## 2023-04-29 DIAGNOSIS — N132 Hydronephrosis with renal and ureteral calculous obstruction: Secondary | ICD-10-CM | POA: Diagnosis present

## 2023-04-29 MED ORDER — FUROSEMIDE 10 MG/ML IJ SOLN
INTRAMUSCULAR | Status: AC
  Filled 2023-04-29: qty 6

## 2023-04-29 MED ORDER — FUROSEMIDE 10 MG/ML IJ SOLN
46.0000 mg | Freq: Once | INTRAMUSCULAR | Status: AC
  Administered 2023-04-29: 46 mg via INTRAVENOUS

## 2023-04-29 MED ORDER — TECHNETIUM TC 99M MERTIATIDE
5.0000 | Freq: Once | INTRAVENOUS | Status: AC | PRN
  Administered 2023-04-29: 5.03 via INTRAVENOUS

## 2023-05-13 ENCOUNTER — Ambulatory Visit: Payer: 59 | Admitting: Urology

## 2023-05-13 ENCOUNTER — Telehealth: Payer: Self-pay

## 2023-05-13 NOTE — Progress Notes (Deleted)
 Name: Wayne Peterson DOB: 10-16-1975 MRN: [id number]  History of Present Illness: Wayne Peterson is a 48 y.o. male who presents today for follow up visit at [facility].  ***He is accompanied by ***. - GU history: 1. Kidney stones. - 08/26/2018: Underwent left URS by Dr. Berneice Heinrich.   At initial visit on 04/06/2023: - Reported intermittent left flank / left low back pain x6 months; typically dull, one recent episode of severe pain. No LUTS.  - Urine microscopy: 6-10 WBC/hpf, >30 RBC/hpf, few bacteria, calcium oxalate crystals present. - Renal ultrasound indeterminate: possible 2 cm left intrarenal stone. CT advised.   Since last visit: > 04/15/2023: CT stone study performed. Dr. Ronne Binning reviewed the CT images and determined patient has bilateral UPJ obstruction with bilateral hydronephrosis along with 3 large left UPJ stones measuring 9 cm, 12 cm, and 7 cm respectively. He advised Lasix renogram to assess patient's renal function to determine optimal surgical approach.  > 04/29/2023: Lasix renogram performed; ***pending radiology read. Contacted Radiology department today (***05/13/2023) to request escalation / priority read.  Today: He reports ***  He {Actions; denies-reports:120008} increased urinary urgency, frequency, nocturia, dysuria, gross hematuria, hesitancy, straining to void, or sensations of incomplete emptying.  He {Actions; denies-reports:120008} flank pain or abdominal pain. He {Actions; denies-reports:120008} fevers, nausea, or vomiting.   Medications: Current Outpatient Medications  Medication Sig Dispense Refill   Cholecalciferol (VITAMIN D3) 50 MCG (2000 UT) TABS Take 4,000 mg by mouth at bedtime.     Multiple Vitamin (MULTIVITAMIN WITH MINERALS) TABS tablet Take 1 tablet by mouth at bedtime.     Plecanatide (TRULANCE) 3 MG TABS Take 1 tablet (3 mg total) by mouth daily. (Patient not taking: Reported on 03/31/2023) 30 tablet 11   Probiotic Product  (PROBIOTIC PO) Take 1 capsule by mouth at bedtime.     No current facility-administered medications for this visit.    Allergies: Allergies  Allergen Reactions   Penicillins Nausea And Vomiting    Did it involve swelling of the face/tongue/throat, SOB, or low BP? No Did it involve sudden or severe rash/hives, skin peeling, or any reaction on the inside of your mouth or nose? No Did you need to seek medical attention at a hospital or doctor's office? No When did it last happen? Teenager If all above answers are "NO", may proceed with cephalosporin use.     Past Medical History:  Diagnosis Date   Colon polyps 06/16/2019   Nine 5-7 mm polyps, two 18-22 mm polyps, pathology with multiple tubular adenomas without high-grade dysplasia, also hyperplastic rectal polyps   Diverticulitis    History of kidney stones    Past Surgical History:  Procedure Laterality Date   COLONOSCOPY N/A 06/16/2019   Procedure: COLONOSCOPY;  Surgeon: Corbin Ade, MD; diverticulosis in sigmoid and descending colon, nine 5-7 mm polyps, two 18-22 mm polyps resected in piecemeal fashion s/p clip placement.  Pathology with multiple tubular adenomas without high-grade dysplasia as well as hyperplastic rectal polyps.  Repeat colonoscopy in 6 months.   COLONOSCOPY N/A 03/20/2020   Procedure: COLONOSCOPY;  Surgeon: Corbin Ade, MD;  [facility];  Service: Endoscopy;  Laterality: N/A;  12:00   COLONOSCOPY WITH PROPOFOL N/A 01/14/2023   Procedure: COLONOSCOPY WITH PROPOFOL;  Surgeon: Corbin Ade, MD;  [facility];  Service: Endoscopy;  Laterality: N/A;  9:00 am, asa 2   CYSTOSCOPY WITH RETROGRADE PYELOGRAM, URETEROSCOPY AND STENT PLACEMENT  08/26/2018   Procedure: CYSTOSCOPY  WITH BILATERAL RETROGRADE PYELOGRAM, LEFT URETEROSCOPY AND LEFT STENT PLACEMENT;  Surgeon: Sebastian Ache, MD;  Location: [facility];  Service: Urology;;   HOLMIUM LASER APPLICATION Left 08/26/2018    Procedure: HOLMIUM LASER APPLICATION;  Surgeon: Sebastian Ache, MD;  Location: [facility];  Service: Urology;  Laterality: Left;   POLYPECTOMY  03/20/2020   Procedure: POLYPECTOMY INTESTINAL;  Surgeon: Corbin Ade, MD;  [facility];  Service: Endoscopy;;   POLYPECTOMY  01/14/2023   Procedure: POLYPECTOMY;  Surgeon: Corbin Ade, MD;  [facility];  Service: Endoscopy;;   WISDOM TOOTH EXTRACTION     Family History  Problem Relation Age of Onset   Cancer Mother    Cirrhosis Father    Colon polyps Maternal Grandfather    Colon cancer Neg Hx    Social History   Socioeconomic History   Marital status: Married    Spouse name: Not on file   Number of children: Not on file   Years of education: Not on file   Highest education level: Not on file  Occupational History   Not on file  Tobacco Use   Smoking status: Every Day    Current packs/day: 0.50    Average packs/day: 0.5 packs/day for 23.0 years (11.5 ttl pk-yrs)    Types: Cigarettes   Smokeless tobacco: Never  Vaping Use   Vaping status: Never Used  Substance and Sexual Activity   Alcohol use: Not Currently   Drug use: Yes    Types: Marijuana    Comment: marijuana "hemp" over the weekend   Sexual activity: Not Currently  Other Topics Concern   Not on file  Social History Narrative   Not on file   Social Drivers of Health   Financial Resource Strain: Not on file  Food Insecurity: Not on file  Transportation Needs: Not on file  Physical Activity: Not on file  Stress: Not on file  Social Connections: Not on file  Intimate Partner Violence: Not on file    Review of Systems Constitutional: Patient denies any unintentional weight loss or change in strength lntegumentary: Patient denies any rashes or pruritus Cardiovascular: Patient denies chest pain or syncope Respiratory: Patient denies shortness of breath Gastrointestinal: ***Patient {Actions; denies-reports:120008}  ***nausea, ***vomiting, ***constipation, ***diarrhea ***As per HPI Musculoskeletal: Patient denies muscle cramps or weakness Neurologic: Patient denies convulsions or seizures Allergic/Immunologic: Patient denies recent allergic reaction(s) Hematologic/Lymphatic: Patient denies bleeding tendencies Endocrine: Patient denies heat/cold intolerance  GU: As per HPI.  OBJECTIVE There were no vitals filed for this visit. There is no height or weight on file to calculate BMI.  Physical Examination Constitutional: No obvious distress; patient is non-toxic appearing  Cardiovascular: No visible lower extremity edema.  Respiratory: The patient does not have audible wheezing/stridor; respirations do not appear labored  Gastrointestinal: Abdomen non-distended Musculoskeletal: Normal ROM of UEs  Skin: No obvious rashes/open sores  Neurologic: CN 2-12 grossly intact Psychiatric: Answered questions appropriately with normal affect  Hematologic/Lymphatic/Immunologic: No obvious bruises or sites of spontaneous bleeding  UA:  ***positive for *** leukocytes, *** blood, ***nitrites ***Urine microscopy:  ***negative  *** WBC/hpf, *** RBC/hpf, *** bacteria ***with no evidence of UTI ***with no evidence of microscopic hematuria ***otherwise unremarkable ***glucosuria (secondary to ***Jardiance ***Farxiga use)  PVR: *** ml  ASSESSMENT No diagnosis found. ***  We agreed to plan for follow up in *** months / ***1 year or sooner if needed. Patient verbalized understanding of and agreement with current plan. All questions  were answered.  PLAN Advised the following: 1. *** 2. ***No follow-ups on file.  No orders of the defined types were placed in this encounter.   It has been explained that the patient is to follow regularly with their PCP in addition to all other providers involved in their care and to follow instructions provided by these respective offices. Patient advised to contact urology  clinic if any urologic-pertaining questions, concerns, new symptoms or problems arise in the interim period.  There are no Patient Instructions on file for this visit.  Electronically signed by:  Donnita Falls, FNP   05/13/23    9:00 AM

## 2023-05-13 NOTE — Telephone Encounter (Signed)
 Patient was made aware and verbalized understanding. Patient is on my schedule this afternoon. Happy to see him but appointment is not necessary unless patient has acute concerns. I plan to call the patient as soon as I get an answer about the surgical plan for him. Still waiting for radiology report for the Lasix renogram done 04/29/2023, which Dr. Ronne Binning requested to assess patient's renal function to determine appropriate surgical recommendation to address the issues identified on the CT done 04/15/2023. Those problems included bilateral UPJ obstruction (blockage to some degree at the juncture between the kidney and ureter), bilateral hydronephrosis (urine backing up into the kidneys), and 3 very large kidney stones (measuring 9 cm, 12 cm, and 7 cm respectively) at the left UPJ.

## 2023-05-13 NOTE — Progress Notes (Deleted)
 Contacted Radiology department to request escalation / priority read of patient's Lasix renogram from 04/29/2023.

## 2023-05-17 ENCOUNTER — Other Ambulatory Visit: Payer: Self-pay | Admitting: Urology

## 2023-05-17 DIAGNOSIS — N200 Calculus of kidney: Secondary | ICD-10-CM

## 2023-05-17 NOTE — Progress Notes (Signed)
 Spoke with patient after consulting with Dr. Ronne Binning; left PCNL advised. Discussed the procedure along with potential risks / benefits. Pt verbalized understanding and agreement. All questions were answered. Surgery request submitted.

## 2023-05-27 ENCOUNTER — Other Ambulatory Visit: Payer: Self-pay

## 2023-05-27 DIAGNOSIS — N200 Calculus of kidney: Secondary | ICD-10-CM

## 2023-05-30 ENCOUNTER — Telehealth: Admitting: Family

## 2023-05-30 DIAGNOSIS — L255 Unspecified contact dermatitis due to plants, except food: Secondary | ICD-10-CM | POA: Diagnosis not present

## 2023-05-30 MED ORDER — PREDNISOLONE SODIUM PHOSPHATE 15 MG/5ML PO SOLN: 40.0000 mg | Freq: Every day | ORAL | 0 refills | Status: AC

## 2023-05-30 NOTE — Progress Notes (Signed)
 Virtual Visit Consent   Wayne Peterson, you are scheduled for a virtual visit with a [facility] provider today. Just as with appointments in the office, your consent must be obtained to participate. Your consent will be active for this visit and any virtual visit you may have with one of our providers in the next 365 days. If you have a MyChart account, a copy of this consent can be sent to you electronically.  As this is a virtual visit, video technology does not allow for your provider to perform a traditional examination. This may limit your provider's ability to fully assess your condition. If your provider identifies any concerns that need to be evaluated in person or the need to arrange testing (such as labs, EKG, etc.), we will make arrangements to do so. Although advances in technology are sophisticated, we cannot ensure that it will always work on either your end or our end. If the connection with a video visit is poor, the visit may have to be switched to a telephone visit. With either a video or telephone visit, we are not always able to ensure that we have a secure connection.  By engaging in this virtual visit, you consent to the provision of healthcare and authorize for your insurance to be billed (if applicable) for the services provided during this visit. Depending on your insurance coverage, you may receive a charge related to this service.  I need to obtain your verbal consent now. Are you willing to proceed with your visit today? Wayne Peterson has provided verbal consent on 05/30/2023 for a virtual visit (video or telephone). Jannifer Rodney, FNP  Date: 05/30/2023 9:09 AM   Virtual Visit via Video Note   I, Jannifer Rodney, connected with  Wayne Peterson  ([id number], 05-25-1975) on 05/30/23 at  9:15 AM EDT by a video-enabled telemedicine application and verified that I am speaking with the correct person using two identifiers.  Location: Patient: Virtual Visit Location  Patient: Home Provider: Virtual Visit Location Provider: Home Office   I discussed the limitations of evaluation and management by telemedicine and the availability of in person appointments. The patient expressed understanding and agreed to proceed.    History of Present Illness: Wayne Peterson is a 48 y.o. who identifies as a male who was assigned male at birth, and is being seen today for rash that started yesterday after being outside doing yard work.  HPI: Rash This is a new problem. The current episode started yesterday. The problem has been gradually worsening since onset. The affected locations include the face, left arm, neck, right wrist, right hand, right arm, left elbow and left wrist. The rash is characterized by redness, itchiness and blistering. He was exposed to plant contact. Past treatments include anti-itch cream. The treatment provided mild relief.    Problems:  Patient Active Problem List   Diagnosis Date Noted   Kidney stones 03/26/2023   History of adenomatous polyp of colon 07/28/2019   Diverticulitis of colon 04/24/2019    Allergies:  Allergies  Allergen Reactions   Penicillins Nausea And Vomiting    Did it involve swelling of the face/tongue/throat, SOB, or low BP? No Did it involve sudden or severe rash/hives, skin peeling, or any reaction on the inside of your mouth or nose? No Did you need to seek medical attention at a hospital or doctor's office? No When did it last happen? Teenager If all above answers are "NO", may proceed  with cephalosporin use.    Medications:  Current Outpatient Medications:    prednisoLONE (ORAPRED) 15 MG/5ML solution, Take 13.3 mLs (40 mg total) by mouth daily before breakfast for 5 days., Disp: 66.5 mL, Rfl: 0   Cholecalciferol (VITAMIN D3) 50 MCG (2000 UT) TABS, Take 4,000 mg by mouth at bedtime., Disp: , Rfl:    Multiple Vitamin (MULTIVITAMIN WITH MINERALS) TABS tablet, Take 1 tablet by mouth at bedtime., Disp: , Rfl:     Probiotic Product (PROBIOTIC PO), Take 1 capsule by mouth at bedtime., Disp: , Rfl:   Observations/Objective: Patient is well-developed, well-nourished in no acute distress.  Resting comfortably  at home.  Head is normocephalic, atraumatic.  No labored breathing.  Speech is clear and coherent with logical content.  Patient is alert and oriented at baseline.  Blister rash on bilateral forearm, neck and face  Assessment and Plan: 1. Contact dermatitis due to plant (Primary) - prednisoLONE (ORAPRED) 15 MG/5ML solution; Take 13.3 mLs (40 mg total) by mouth daily before breakfast for 5 days.  Dispense: 66.5 mL; Refill: 0  Avoid scratching Keep clean and dry  Report any s/s of infection  Follow up if symptoms worsen or do not improve   Follow Up Instructions: I discussed the assessment and treatment plan with the patient. The patient was provided an opportunity to ask questions and all were answered. The patient agreed with the plan and demonstrated an understanding of the instructions.  A copy of instructions were sent to the patient via MyChart unless otherwise noted below.     The patient was advised to call back or seek an in-person evaluation if the symptoms worsen or if the condition fails to improve as anticipated.    Jannifer Rodney, FNP

## 2023-06-01 ENCOUNTER — Other Ambulatory Visit: Payer: Self-pay

## 2023-06-01 DIAGNOSIS — Z01818 Encounter for other preprocedural examination: Secondary | ICD-10-CM

## 2023-06-05 ENCOUNTER — Encounter (HOSPITAL_COMMUNITY): Payer: Self-pay | Admitting: Emergency Medicine

## 2023-06-05 ENCOUNTER — Emergency Department (HOSPITAL_COMMUNITY)

## 2023-06-05 ENCOUNTER — Emergency Department (HOSPITAL_COMMUNITY)
Admission: EM | Admit: 2023-06-05 | Discharge: 2023-06-05 | Disposition: A | Discharge status: Home / Self Care | Attending: Emergency Medicine | Admitting: Emergency Medicine

## 2023-06-05 ENCOUNTER — Other Ambulatory Visit: Payer: Self-pay

## 2023-06-05 DIAGNOSIS — R109 Unspecified abdominal pain: Secondary | ICD-10-CM | POA: Diagnosis present

## 2023-06-05 DIAGNOSIS — N200 Calculus of kidney: Secondary | ICD-10-CM

## 2023-06-05 DIAGNOSIS — N132 Hydronephrosis with renal and ureteral calculous obstruction: Secondary | ICD-10-CM | POA: Diagnosis not present

## 2023-06-05 LAB — URINALYSIS, ROUTINE W REFLEX MICROSCOPIC
Bacteria, UA: NONE SEEN
Bilirubin Urine: NEGATIVE
Glucose, UA: NEGATIVE mg/dL
Ketones, ur: 20 mg/dL — AB
Leukocytes,Ua: NEGATIVE
Nitrite: NEGATIVE
Protein, ur: 100 mg/dL — AB
RBC / HPF: >50 RBC/hpf (ref 0–5)
Specific Gravity, Urine: 1.019 (ref 1.005–1.030)
pH: 8 (ref 5.0–8.0)

## 2023-06-05 LAB — CBC WITH DIFFERENTIAL/PLATELET
Abs Immature Granulocytes: 0.13 10*3/uL — ABNORMAL HIGH (ref 0.00–0.07)
Basophils Absolute: 0.1 10*3/uL (ref 0.0–0.1)
Basophils Relative: 1 %
Eosinophils Absolute: 0.3 10*3/uL (ref 0.0–0.5)
Eosinophils Relative: 2 %
HCT: 41.7 % (ref 39.0–52.0)
Hemoglobin: 14.7 g/dL (ref 13.0–17.0)
Immature Granulocytes: 1 %
Lymphocytes Relative: 15 %
Lymphs Abs: 2.3 10*3/uL (ref 0.7–4.0)
MCH: 31.8 pg (ref 26.0–34.0)
MCHC: 35.3 g/dL (ref 30.0–36.0)
MCV: 90.3 fL (ref 80.0–100.0)
Monocytes Absolute: 1.3 10*3/uL — ABNORMAL HIGH (ref 0.1–1.0)
Monocytes Relative: 8 %
Neutro Abs: 11.4 10*3/uL — ABNORMAL HIGH (ref 1.7–7.7)
Neutrophils Relative %: 73 %
Platelets: 333 10*3/uL (ref 150–400)
RBC: 4.62 MIL/uL (ref 4.22–5.81)
RDW: 12.3 % (ref 11.5–15.5)
WBC: 15.5 10*3/uL — ABNORMAL HIGH (ref 4.0–10.5)
nRBC: 0 % (ref 0.0–0.2)

## 2023-06-05 LAB — COMPREHENSIVE METABOLIC PANEL WITH GFR
ALT: 28 U/L (ref 0–44)
AST: 21 U/L (ref 15–41)
Albumin: 4.1 g/dL (ref 3.5–5.0)
Alkaline Phosphatase: 82 U/L (ref 38–126)
Anion gap: 12 (ref 5–15)
BUN: 17 mg/dL (ref 6–20)
CO2: 19 mmol/L — ABNORMAL LOW (ref 22–32)
Calcium: 9.1 mg/dL (ref 8.9–10.3)
Chloride: 104 mmol/L (ref 98–111)
Creatinine, Ser: 1.24 mg/dL (ref 0.61–1.24)
GFR, Estimated: >60 mL/min (ref >60)
Glucose, Bld: 111 mg/dL — ABNORMAL HIGH (ref 70–99)
Potassium: 3.3 mmol/L — ABNORMAL LOW (ref 3.5–5.1)
Sodium: 135 mmol/L (ref 135–145)
Total Bilirubin: 0.7 mg/dL (ref 0.0–1.2)
Total Protein: 6.8 g/dL (ref 6.5–8.1)

## 2023-06-05 MED ORDER — ONDANSETRON HCL 4 MG/2ML IJ SOLN
4.0000 mg | Freq: Once | INTRAMUSCULAR | Status: AC
  Administered 2023-06-05: 4 mg via INTRAVENOUS
  Filled 2023-06-05: qty 2

## 2023-06-05 MED ORDER — OXYCODONE-ACETAMINOPHEN 5-325 MG PO TABS: 1.0000 | ORAL_TABLET | Freq: Four times a day (QID) | ORAL | 0 refills | Status: DC | PRN

## 2023-06-05 MED ORDER — HYDROMORPHONE HCL 1 MG/ML IJ SOLN
1.0000 mg | Freq: Once | INTRAMUSCULAR | Status: AC
  Administered 2023-06-05: 1 mg via INTRAVENOUS
  Filled 2023-06-05: qty 1

## 2023-06-05 MED ORDER — HYDROMORPHONE HCL 1 MG/ML IJ SOLN
0.5000 mg | Freq: Once | INTRAMUSCULAR | Status: AC
  Administered 2023-06-05: 0.5 mg via INTRAVENOUS
  Filled 2023-06-05: qty 0.5

## 2023-06-05 MED ORDER — ONDANSETRON 4 MG PO TBDP: ORAL_TABLET | ORAL | 0 refills | Status: DC

## 2023-06-05 MED ORDER — KETOROLAC TROMETHAMINE 30 MG/ML IJ SOLN
30.0000 mg | Freq: Once | INTRAMUSCULAR | Status: AC
  Administered 2023-06-05: 30 mg via INTRAVENOUS
  Filled 2023-06-05: qty 1

## 2023-06-05 NOTE — ED Triage Notes (Signed)
 Pt BIB from [facility] c/o flank pain with known kidney stones, pt has been being monitored by urology, increased pain today

## 2023-06-05 NOTE — ED Provider Notes (Signed)
 [place] EMERGENCY DEPARTMENT AT [facility] Provider Note   CSN: [id number] Arrival date & time: 06/05/23  1805     History {Add pertinent medical, surgical, social history, OB history to HPI:1} Chief Complaint  Patient presents with   Flank Pain    Wayne Peterson is a 48 y.o. male.  Patient complains of severe left flank pain   Flank Pain       Home Medications Prior to Admission medications   Medication Sig Start Date End Date Taking? Authorizing Provider  Cholecalciferol (VITAMIN D3) 50 MCG (2000 UT) TABS Take 4,000 mg by mouth at bedtime.   Yes [provider]  ibuprofen (ADVIL) 200 MG tablet Take 400 mg by mouth daily as needed for moderate pain (pain score 4-6) or mild pain (pain score 1-3).   Yes [provider]  Multiple Vitamin (MULTIVITAMIN WITH MINERALS) TABS tablet Take 1 tablet by mouth at bedtime.   Yes [provider]  ondansetron (ZOFRAN-ODT) 4 MG disintegrating tablet 4mg  ODT q4 hours prn nausea/vomit 06/05/23  Yes Bethann Berkshire, MD  oxyCODONE-acetaminophen (PERCOCET/ROXICET) 5-325 MG tablet Take 1 tablet by mouth every 6 (six) hours as needed for severe pain (pain score 7-10). 06/05/23  Yes Bethann Berkshire, MD  oxyCODONE-acetaminophen (PERCOCET/ROXICET) 5-325 MG tablet Take 1 tablet by mouth every 6 (six) hours as needed for severe pain (pain score 7-10). 06/05/23  Yes Bethann Berkshire, MD  Probiotic Product (PROBIOTIC PO) Take 1 capsule by mouth at bedtime.   Yes [provider]      Allergies    Penicillins    Review of Systems   Review of Systems  Genitourinary:  Positive for flank pain.    Physical Exam Updated Vital Signs BP 130/79   Pulse 63   Temp 98.5 F (36.9 C) (Axillary)   Resp (!) 21   Ht 5\' 10"  (1.778 m)   Wt 90.7 kg   SpO2 97%   BMI 28.70 kg/m  Physical Exam  ED Results / Procedures / Treatments   Labs (all labs ordered are listed, but only abnormal results are  displayed) Labs Reviewed  CBC WITH DIFFERENTIAL/PLATELET - Abnormal; Notable for the following components:      Result Value   WBC 15.5 (*)    Neutro Abs 11.4 (*)    Monocytes Absolute 1.3 (*)    Abs Immature Granulocytes 0.13 (*)    All other components within normal limits  COMPREHENSIVE METABOLIC PANEL WITH GFR - Abnormal; Notable for the following components:   Potassium 3.3 (*)    CO2 19 (*)    Glucose, Bld 111 (*)    All other components within normal limits  URINALYSIS, ROUTINE W REFLEX MICROSCOPIC - Abnormal; Notable for the following components:   APPearance TURBID (*)    Hgb urine dipstick MODERATE (*)    Ketones, ur 20 (*)    Protein, ur 100 (*)    Crystals PRESENT (*)    All other components within normal limits    EKG None  Radiology CT Renal Stone Study Result Date: 06/05/2023 CLINICAL DATA:  Abdominal/flank pain, stone suspected EXAM: CT ABDOMEN AND PELVIS WITHOUT CONTRAST TECHNIQUE: Multidetector CT imaging of the abdomen and pelvis was performed following the standard protocol without IV contrast. RADIATION DOSE REDUCTION: This exam was performed according to the departmental dose-optimization program which includes automated exposure control, adjustment of the mA and/or kV according to patient size and/or use of iterative reconstruction technique. COMPARISON:  April 15, 2023  FINDINGS: Evaluation is limited by lack of IV contrast. Lower chest: No acute abnormality. Hepatobiliary: Unremarkable noncontrast appearance of the liver and gallbladder. Pancreas: No peripancreatic fat stranding. Spleen: Unremarkable. Adrenals/Urinary Tract: Adrenal glands are unremarkable. Revisualization of large bilateral extrarenal pelves. No RIGHT-sided hydronephrosis or nephrolithiasis. There is a new mild LEFT-sided hydronephrosis superimposed on this large extrarenal pelvis. Revisualization of multiple LEFT-sided nephrolithiasis sitting within this extrarenal pelvis. A confluence of 2  stones now sits at the origin of the ureter and span in total 17 mm. There is mild adjacent fat stranding. Bladder is unremarkable. Stomach/Bowel: No evidence of bowel obstruction. Scattered diverticulosis. Appendix is normal. Stomach is unremarkable. Vascular/Lymphatic: No significant vascular findings are present. No enlarged abdominal or pelvic lymph nodes. Reproductive: Prostate is unremarkable. Other: Small fat containing LEFT inguinal hernia. Musculoskeletal: No acute or significant osseous findings. IMPRESSION: There is a new mild LEFT-sided hydronephrosis superimposed on a large extrarenal pelvis. There is a confluence of 2 nephrolithiasis now sitting at the origin of the ureter that span in total 17 mm. There is mild adjacent fat stranding. Electronically Signed   By: Meda Klinefelter M.D.   On: 06/05/2023 19:02    Procedures Procedures  {Document cardiac monitor, telemetry assessment procedure when appropriate:1}  Medications Ordered in ED Medications  ketorolac (TORADOL) 30 MG/ML injection 30 mg (30 mg Intravenous Given 06/05/23 1829)  HYDROmorphone (DILAUDID) injection 0.5 mg (0.5 mg Intravenous Given 06/05/23 1828)  ondansetron (ZOFRAN) injection 4 mg (4 mg Intravenous Given 06/05/23 1828)  HYDROmorphone (DILAUDID) injection 1 mg (1 mg Intravenous Given 06/05/23 2101)  ondansetron (ZOFRAN) injection 4 mg (4 mg Intravenous Given 06/05/23 2101)    ED Course/ Medical Decision Making/ A&P   {   Click here for ABCD2, HEART and other calculatorsREFRESH Note before signing :1}                              Medical Decision Making Amount and/or Complexity of Data Reviewed Labs: ordered. Radiology: ordered.  Risk Prescription drug management.   Patient with 2 kidney stones on the left.  He will follow-up with Dr. Ronne Binning this week  {Document critical care time when appropriate:1} {Document review of labs and clinical decision tools ie heart score, Chads2Vasc2 etc:1}  {Document your  independent review of radiology images, and any outside records:1} {Document your discussion with family members, caretakers, and with consultants:1} {Document social determinants of health affecting pt's care:1} {Document your decision making why or why not admission, treatments were needed:1} Final Clinical Impression(s) / ED Diagnoses Final diagnoses:  Kidney stone    Rx / DC Orders ED Discharge Orders          Ordered    oxyCODONE-acetaminophen (PERCOCET/ROXICET) 5-325 MG tablet  Every 6 hours PRN        06/05/23 2134    oxyCODONE-acetaminophen (PERCOCET/ROXICET) 5-325 MG tablet  Every 6 hours PRN        06/05/23 2135    ondansetron (ZOFRAN-ODT) 4 MG disintegrating tablet        06/05/23 2135

## 2023-06-05 NOTE — Discharge Instructions (Signed)
 Follow-up with Dr. Ronne Binning next week.  If you have problems over the weekend you should go to [facility] in [place]

## 2023-06-06 MED FILL — Oxycodone w/ Acetaminophen Tab 5-325 MG: ORAL | Qty: 6 | Status: AC

## 2023-06-07 ENCOUNTER — Other Ambulatory Visit: Payer: Self-pay | Admitting: Urology

## 2023-06-07 ENCOUNTER — Telehealth: Payer: Self-pay | Admitting: Urology

## 2023-06-07 NOTE — Telephone Encounter (Signed)
 Left message needing pain medication for kidney stones

## 2023-06-08 NOTE — Telephone Encounter (Addendum)
 Called patient, patient state's he wanted to informing Dr. Ronne Binning of his hospital visited on 06/05/23 and had a CT done and would like for the MD to review CT.Marland Kitchen Patient state's he has 2 stones that had moved to his ureter according to his CT from the hospital. Patient state's he has not pick up pain medication as of yet due to him not being in pain and plan to pick up medication this week. Patient is aware a message will be sent to MD, patient voiced understanding. "Per chart review the ER provider wrote him two prescriptions for Percocet just two days ago on 06/05/2023, which he has yet to fill per the PMP aware controlled substance registry. "

## 2023-06-11 ENCOUNTER — Ambulatory Visit (HOSPITAL_BASED_OUTPATIENT_CLINIC_OR_DEPARTMENT_OTHER): Admitting: Student

## 2023-06-11 ENCOUNTER — Encounter (HOSPITAL_BASED_OUTPATIENT_CLINIC_OR_DEPARTMENT_OTHER): Payer: Self-pay | Admitting: Student

## 2023-06-11 DIAGNOSIS — S86112A Strain of other muscle(s) and tendon(s) of posterior muscle group at lower leg level, left leg, initial encounter: Secondary | ICD-10-CM

## 2023-06-11 NOTE — Progress Notes (Signed)
 Chief Complaint: Left calf pain    Discussed the use of AI scribe software for clinical note transcription with the patient, who gave verbal consent to proceed.  History of Present Illness Wayne Peterson is a 48 year old male who presents with left calf pain after playing basketball.  He has left calf pain that began after playing basketball with his children last night. The pain initially felt like a cramp that did not subside and was significant last night. It is localized to the middle of the left calf and is described as a sensation of heat. The pain is most noticeable when pushing off or stepping, causing him to hobble. There is no radiation to the knee or further down the leg. He recently started exercising again and had been sore from previous workouts earlier in the week. He applied some topicals to the area last night as well as massage which provided some relief by this morning. He has not experienced any similar muscle injuries in the past and is concerned about the current pain.  Surgical History:   None  PMH/PSH/Family History/Social History/Meds/Allergies:    Past Medical History:  Diagnosis Date   Colon polyps 06/16/2019   Nine 5-7 mm polyps, two 18-22 mm polyps, pathology with multiple tubular adenomas without high-grade dysplasia, also hyperplastic rectal polyps   Diverticulitis    History of kidney stones    Past Surgical History:  Procedure Laterality Date   COLONOSCOPY N/A 06/16/2019   Procedure: COLONOSCOPY;  Surgeon: Corbin Ade, MD; diverticulosis in sigmoid and descending colon, nine 5-7 mm polyps, two 18-22 mm polyps resected in piecemeal fashion s/p clip placement.  Pathology with multiple tubular adenomas without high-grade dysplasia as well as hyperplastic rectal polyps.  Repeat colonoscopy in 6 months.   COLONOSCOPY N/A 03/20/2020   Procedure: COLONOSCOPY;  Surgeon: Corbin Ade, MD;  [facility]: [facility];   Service: Endoscopy;  Laterality: N/A;  12:00   COLONOSCOPY WITH PROPOFOL N/A 01/14/2023   Procedure: COLONOSCOPY WITH PROPOFOL;  Surgeon: Corbin Ade, MD;  [facility]: [facility];  Service: Endoscopy;  Laterality: N/A;  9:00 am, asa 2   CYSTOSCOPY WITH RETROGRADE PYELOGRAM, URETEROSCOPY AND STENT PLACEMENT  08/26/2018   Procedure: CYSTOSCOPY WITH BILATERAL RETROGRADE PYELOGRAM, LEFT URETEROSCOPY AND LEFT STENT PLACEMENT;  Surgeon: Sebastian Ache, MD;  [facility];  Service: Urology;;   HOLMIUM LASER APPLICATION Left 08/26/2018   Procedure: HOLMIUM LASER APPLICATION;  Surgeon: Sebastian Ache, MD;  Location: [facility];  Service: Urology;  Laterality: Left;   POLYPECTOMY  03/20/2020   Procedure: POLYPECTOMY INTESTINAL;  Surgeon: Corbin Ade, MD;  [facility];  Service: Endoscopy;;   POLYPECTOMY  01/14/2023   Procedure: POLYPECTOMY;  Surgeon: Corbin Ade, MD;  [facility];  Service: Endoscopy;;   WISDOM TOOTH EXTRACTION     Social History   Socioeconomic History   Marital status: Married    Spouse name: Not on file   Number of children: Not on file   Years of education: Not on file   Highest education level: Not on file  Occupational History   Not on file  Tobacco Use   Smoking status: Every Day    Current packs/day: 0.50    Average packs/day: 0.5 packs/day for  23.0 years (11.5 ttl pk-yrs)    Types: Cigarettes   Smokeless tobacco: Never  Vaping Use   Vaping status: Never Used  Substance and Sexual Activity   Alcohol use: Not Currently   Drug use: Yes    Types: Marijuana    Comment: marijuana "hemp" over the weekend   Sexual activity: Not Currently  Other Topics Concern   Not on file  Social History Narrative   Not on file   Social Drivers of Health   Financial Resource Strain: Not on file  Food Insecurity: Not on file  Transportation Needs: Not on file  Physical Activity: Not on file  Stress: Not  on file  Social Connections: Not on file   Family History  Problem Relation Age of Onset   Cancer Mother    Cirrhosis Father    Colon polyps Maternal Grandfather    Colon cancer Neg Hx    Allergies  Allergen Reactions   Penicillins Nausea And Vomiting    Did it involve swelling of the face/tongue/throat, SOB, or low BP? No Did it involve sudden or severe rash/hives, skin peeling, or any reaction on the inside of your mouth or nose? No Did you need to seek medical attention at a hospital or doctor's office? No When did it last happen? Teenager If all above answers are "NO", may proceed with cephalosporin use.    Current Outpatient Medications  Medication Sig Dispense Refill   Cholecalciferol (VITAMIN D3) 50 MCG (2000 UT) TABS Take 4,000 mg by mouth at bedtime.     ibuprofen (ADVIL) 200 MG tablet Take 400 mg by mouth daily as needed for moderate pain (pain score 4-6) or mild pain (pain score 1-3).     Multiple Vitamin (MULTIVITAMIN WITH MINERALS) TABS tablet Take 1 tablet by mouth at bedtime.     ondansetron (ZOFRAN-ODT) 4 MG disintegrating tablet 4mg  ODT q4 hours prn nausea/vomit 10 tablet 0   oxyCODONE-acetaminophen (PERCOCET/ROXICET) 5-325 MG tablet Take 1 tablet by mouth every 6 (six) hours as needed for severe pain (pain score 7-10). 6 tablet 0   oxyCODONE-acetaminophen (PERCOCET/ROXICET) 5-325 MG tablet Take 1 tablet by mouth every 6 (six) hours as needed for severe pain (pain score 7-10). 20 tablet 0   Probiotic Product (PROBIOTIC PO) Take 1 capsule by mouth at bedtime.     No current facility-administered medications for this visit.   No results found.  Review of Systems:   A ROS was performed including pertinent positives and negatives as documented in the HPI.  Physical Exam :   Constitutional: NAD and appears stated age Neurological: Alert and oriented Psych: Appropriate affect and cooperative There were no vitals taken for this visit.   Comprehensive  Musculoskeletal Exam:    Exam of the left calf demonstrates tenderness with palpation over the medial head of the gastrocnemius.  No palpable deformity.  No tenderness in the posterior knee or Achilles tendon with negative Thompson test.  Ankle dorsiflexion and plantarflexion strength is 5/5.  Pain is exacerbated with ankle dorsiflexion with knee extended.  Imaging:        Assessment & Plan Left calf muscle strain   Acute strain of the left medial gastrocnemius muscle with intact tendons and muscle integrity. Conservative treatment should improve the condition within next 2-4 weeks. Apply ice for 1-2 days to reduce inflammation, then can begin to introduce heat after 4-5 days to relieve tightness. Perform gentle stretching exercises to maintain flexibility. Consider ibuprofen for pain and inflammation. Seek further evaluation  if no improvement after 4 weeks.     I personally saw and evaluated the patient, and participated in the management and treatment plan.  Hazle Nordmann, PA-C Orthopedics

## 2023-06-22 ENCOUNTER — Other Ambulatory Visit: Payer: Self-pay

## 2023-06-22 ENCOUNTER — Other Ambulatory Visit

## 2023-06-22 DIAGNOSIS — Z01818 Encounter for other preprocedural examination: Secondary | ICD-10-CM

## 2023-06-30 ENCOUNTER — Other Ambulatory Visit (HOSPITAL_COMMUNITY)

## 2023-06-30 ENCOUNTER — Other Ambulatory Visit

## 2023-06-30 ENCOUNTER — Ambulatory Visit (HOSPITAL_COMMUNITY)

## 2023-06-30 ENCOUNTER — Telehealth: Payer: Self-pay

## 2023-06-30 DIAGNOSIS — Z01818 Encounter for other preprocedural examination: Secondary | ICD-10-CM

## 2023-06-30 LAB — URINALYSIS, ROUTINE W REFLEX MICROSCOPIC
Bilirubin, UA: NEGATIVE
Glucose, UA: NEGATIVE
Ketones, UA: NEGATIVE
Nitrite, UA: NEGATIVE
Specific Gravity, UA: 1.03 (ref 1.005–1.030)
Urobilinogen, Ur: 0.2 mg/dL (ref 0.2–1.0)
pH, UA: 6 (ref 5.0–7.5)

## 2023-06-30 LAB — MICROSCOPIC EXAMINATION
Bacteria, UA: NONE SEEN
RBC, Urine: >30 /HPF — AB (ref 0–2)

## 2023-06-30 MED ORDER — SULFAMETHOXAZOLE-TRIMETHOPRIM 800-160 MG PO TABS: 1.0000 | ORAL_TABLET | Freq: Two times a day (BID) | ORAL | 0 refills | Status: DC

## 2023-06-30 NOTE — Telephone Encounter (Signed)
 Called pt to inform him of positive infection per MD and let him know a Rx has been sent to his pharmacy pt voiced understanding

## 2023-07-05 ENCOUNTER — Encounter: Admitting: Urology

## 2023-07-07 ENCOUNTER — Ambulatory Visit (HOSPITAL_COMMUNITY)

## 2023-07-07 ENCOUNTER — Other Ambulatory Visit (HOSPITAL_COMMUNITY)

## 2023-07-13 ENCOUNTER — Encounter (HOSPITAL_COMMUNITY): Payer: Self-pay

## 2023-07-13 ENCOUNTER — Other Ambulatory Visit (HOSPITAL_COMMUNITY): Payer: Self-pay | Admitting: Radiology

## 2023-07-13 ENCOUNTER — Other Ambulatory Visit: Payer: Self-pay | Admitting: Radiology

## 2023-07-13 ENCOUNTER — Other Ambulatory Visit: Payer: Self-pay

## 2023-07-13 ENCOUNTER — Encounter (HOSPITAL_COMMUNITY)
Admission: RE | Admit: 2023-07-13 | Discharge: 2023-07-13 | Disposition: A | Discharge status: Home / Self Care | Source: Ambulatory Visit | Attending: Urology | Admitting: Urology

## 2023-07-13 DIAGNOSIS — N200 Calculus of kidney: Secondary | ICD-10-CM

## 2023-07-13 MED ORDER — LEVOFLOXACIN IN D5W 750 MG/150ML IV SOLN: 500.0000 mg | INTRAVENOUS | Status: DC

## 2023-07-14 ENCOUNTER — Other Ambulatory Visit: Payer: Self-pay | Admitting: Urology

## 2023-07-14 ENCOUNTER — Other Ambulatory Visit: Payer: Self-pay

## 2023-07-14 ENCOUNTER — Other Ambulatory Visit: Payer: Self-pay | Admitting: Radiology

## 2023-07-14 ENCOUNTER — Ambulatory Visit (HOSPITAL_COMMUNITY)
Admission: RE | Admit: 2023-07-14 | Discharge: 2023-07-14 | Disposition: A | Discharge status: Home / Self Care | Source: Ambulatory Visit | Attending: Urology | Admitting: Urology

## 2023-07-14 ENCOUNTER — Encounter: Admitting: Urology

## 2023-07-14 ENCOUNTER — Encounter (HOSPITAL_COMMUNITY): Payer: Self-pay

## 2023-07-14 DIAGNOSIS — N200 Calculus of kidney: Secondary | ICD-10-CM | POA: Diagnosis not present

## 2023-07-14 DIAGNOSIS — N132 Hydronephrosis with renal and ureteral calculous obstruction: Secondary | ICD-10-CM | POA: Insufficient documentation

## 2023-07-14 HISTORY — PX: IR  NEPHROURETERAL CATH PLACE LEFT: IMG6065

## 2023-07-14 LAB — PROTIME-INR
INR: 1 (ref 0.8–1.2)
Prothrombin Time: 13 s (ref 11.4–15.2)

## 2023-07-14 LAB — CBC
HCT: 42.4 % (ref 39.0–52.0)
Hemoglobin: 14.6 g/dL (ref 13.0–17.0)
MCH: 31.8 pg (ref 26.0–34.0)
MCHC: 34.4 g/dL (ref 30.0–36.0)
MCV: 92.4 fL (ref 80.0–100.0)
Platelets: 281 10*3/uL (ref 150–400)
RBC: 4.59 MIL/uL (ref 4.22–5.81)
RDW: 12.4 % (ref 11.5–15.5)
WBC: 6.2 10*3/uL (ref 4.0–10.5)
nRBC: 0 % (ref 0.0–0.2)

## 2023-07-14 MED ORDER — LIDOCAINE HCL 1 % IJ SOLN: 10.0000 mL | Freq: Once | INTRAMUSCULAR | Status: DC

## 2023-07-14 MED ORDER — FENTANYL CITRATE (PF) 100 MCG/2ML IJ SOLN
INTRAMUSCULAR | Status: AC
  Filled 2023-07-14: qty 2

## 2023-07-14 MED ORDER — SODIUM CHLORIDE 0.9% FLUSH: 3.0000 mL | INTRAVENOUS | Status: DC | PRN

## 2023-07-14 MED ORDER — MIDAZOLAM HCL 2 MG/2ML IJ SOLN
INTRAMUSCULAR | Status: AC
  Filled 2023-07-14: qty 2

## 2023-07-14 MED ORDER — LIDOCAINE HCL 1 % IJ SOLN
20.0000 mL | Freq: Once | INTRAMUSCULAR | Status: AC
Start: 2023-07-14 — End: 2023-07-14
  Administered 2023-07-14: 10 mL

## 2023-07-14 MED ORDER — SODIUM CHLORIDE 0.9% FLUSH: 3.0000 mL | Freq: Two times a day (BID) | INTRAVENOUS | Status: DC

## 2023-07-14 MED ORDER — IOHEXOL 300 MG/ML  SOLN
50.0000 mL | Freq: Once | INTRAMUSCULAR | Status: AC | PRN
  Administered 2023-07-14: 30 mL

## 2023-07-14 MED ORDER — LEVOFLOXACIN IN D5W 250 MG/50ML IV SOLN
INTRAVENOUS | Status: AC | PRN
  Administered 2023-07-14: 500 mg via INTRAVENOUS

## 2023-07-14 MED ORDER — IOHEXOL 300 MG/ML  SOLN: 50.0000 mL | Freq: Once | INTRAMUSCULAR | Status: DC | PRN

## 2023-07-14 MED ORDER — SODIUM CHLORIDE 0.9 % IV SOLN
1.0000 g | Freq: Once | INTRAVENOUS | Status: DC
  Filled 2023-07-14 (×2): qty 10

## 2023-07-14 MED ORDER — FENTANYL CITRATE (PF) 100 MCG/2ML IJ SOLN
INTRAMUSCULAR | Status: AC | PRN
  Administered 2023-07-14: 50 ug via INTRAVENOUS

## 2023-07-14 MED ORDER — LIDOCAINE HCL 1 % IJ SOLN
INTRAMUSCULAR | Status: AC
Start: 2023-07-14 — End: ?
  Filled 2023-07-14: qty 20

## 2023-07-14 MED ORDER — LEVOFLOXACIN IN D5W 500 MG/100ML IV SOLN
500.0000 mg | Freq: Once | INTRAVENOUS | Status: DC
  Filled 2023-07-14: qty 100

## 2023-07-14 MED ORDER — MIDAZOLAM HCL 2 MG/2ML IJ SOLN
INTRAMUSCULAR | Status: AC | PRN
  Administered 2023-07-14 (×2): 1 mg via INTRAVENOUS

## 2023-07-14 NOTE — Procedures (Addendum)
 Interventional Radiology Procedure Note  Procedure: Image guided left percutaneous nephro-ureteral drain placement, 38F kumpe catheter  Findings: the catheter will accept any 035 wire to the bladder.   UPJ narrowing  Complications: None  EBL: None  Recommendations: - Routine drain care, capped - To OR tomorrow at [facility] - 1 hr dc home - advance diet   Signed,  Marciano Settles. Mabel Savage, DO, ABVM, RPVI

## 2023-07-14 NOTE — H&P (Addendum)
 Chief Complaint: Patient was seen in consultation today for left kidney stones; placement of Left nephrostomy tube pre operatively-- stone removal in OR  Referring Physician(s): McKenzie,Patrick L  Supervising Physician: Myrlene Asper  Patient Status: [facility] - Out-pt  History of Present Illness: Wayne Peterson is a 47 y.o.male   FULL Code status per pt Symptomatic left renal stones. He is scheduled for PCNL procedure with urology tomorrow in OR  He is here today for IR to place left nephroureteral access catheter.  PMHx, meds, labs, imaging, allergies reviewed. Feels well, no recent fevers, chills, illness. Urinating well No dysuria   Past Medical History:  Diagnosis Date   Colon polyps 06/16/2019   Nine 5-7 mm polyps, two 18-22 mm polyps, pathology with multiple tubular adenomas without high-grade dysplasia, also hyperplastic rectal polyps   Diverticulitis    History of kidney stones     Past Surgical History:  Procedure Laterality Date   COLONOSCOPY N/A 06/16/2019   Procedure: COLONOSCOPY;  Surgeon: Suzette Espy, MD; diverticulosis in sigmoid and descending colon, nine 5-7 mm polyps, two 18-22 mm polyps resected in piecemeal fashion s/p clip placement.  Pathology with multiple tubular adenomas without high-grade dysplasia as well as hyperplastic rectal polyps.  Repeat colonoscopy in 6 months.   COLONOSCOPY N/A 03/20/2020   Procedure: COLONOSCOPY;  Surgeon: Suzette Espy, MD;  [facility];  Service: Endoscopy;  Laterality: N/A;  12:00   COLONOSCOPY WITH PROPOFOL  N/A 01/14/2023   Procedure: COLONOSCOPY WITH PROPOFOL ;  Surgeon: Suzette Espy, MD;  [facility];  Service: Endoscopy;  Laterality: N/A;  9:00 am, asa 2   CYSTOSCOPY WITH RETROGRADE PYELOGRAM, URETEROSCOPY AND STENT PLACEMENT  08/26/2018   Procedure: CYSTOSCOPY WITH BILATERAL RETROGRADE PYELOGRAM, LEFT URETEROSCOPY AND LEFT STENT PLACEMENT;  Surgeon: Osborn Blaze, MD;  Location:  [facility] [place];  Service: Urology;;   HOLMIUM LASER APPLICATION Left 08/26/2018   Procedure: HOLMIUM LASER APPLICATION;  Surgeon: Osborn Blaze, MD;  Location: [facility];  Service: Urology;  Laterality: Left;   POLYPECTOMY  03/20/2020   Procedure: POLYPECTOMY INTESTINAL;  Surgeon: Suzette Espy, MD;  [facility];  Service: Endoscopy;;   POLYPECTOMY  01/14/2023   Procedure: POLYPECTOMY;  Surgeon: Suzette Espy, MD;  [facility];  Service: Endoscopy;;   WISDOM TOOTH EXTRACTION      Allergies: Penicillins  Medications: Prior to Admission medications   Medication Sig Start Date End Date Taking? Authorizing Provider  Cholecalciferol (VITAMIN D3) 50 MCG (2000 UT) TABS Take 4,000 mg by mouth at bedtime.    [provider]  ibuprofen (ADVIL) 200 MG tablet Take 400 mg by mouth every 8 (eight) hours as needed (pain.).    [provider]  Multiple Vitamin (MULTIVITAMIN WITH MINERALS) TABS tablet Take 1 tablet by mouth at bedtime.    [provider]  ondansetron  (ZOFRAN -ODT) 4 MG disintegrating tablet 4mg  ODT q4 hours prn nausea/vomit Patient not taking: Reported on 07/07/2023 06/05/23   Zammit, Joseph, MD  oxyCODONE -acetaminophen  (PERCOCET/ROXICET) 5-325 MG tablet Take 1 tablet by mouth every 6 (six) hours as needed for severe pain (pain score 7-10). Patient not taking: Reported on 07/07/2023 06/05/23   Zammit, Joseph, MD  oxyCODONE -acetaminophen  (PERCOCET/ROXICET) 5-325 MG tablet Take 1 tablet by mouth every 6 (six) hours as needed for severe pain (pain score 7-10). Patient not taking: Reported on 07/07/2023 06/05/23   Zammit, Joseph, MD  Probiotic Product (PROBIOTIC PO) Take 1 capsule by  mouth at bedtime.    [provider]  sulfamethoxazole -trimethoprim  (BACTRIM  DS) 800-160 MG tablet Take 1 tablet by mouth 2 (two) times daily. 06/30/23   McKenzie, Arden Beck, MD     Family History  Problem Relation Age of Onset    Cancer Mother    Cirrhosis Father    Colon polyps Maternal Grandfather    Colon cancer Neg Hx     Social History   Socioeconomic History   Marital status: Married    Spouse name: Not on file   Number of children: Not on file   Years of education: Not on file   Highest education level: Not on file  Occupational History   Not on file  Tobacco Use   Smoking status: Every Day    Current packs/day: 0.50    Average packs/day: 0.5 packs/day for 23.0 years (11.5 ttl pk-yrs)    Types: Cigarettes   Smokeless tobacco: Never  Vaping Use   Vaping status: Never Used  Substance and Sexual Activity   Alcohol use: Not Currently   Drug use: Yes    Types: Marijuana    Comment: marijuana "hemp" over the weekend   Sexual activity: Not Currently  Other Topics Concern   Not on file  Social History Narrative   Not on file   Social Drivers of Health   Financial Resource Strain: Not on file  Food Insecurity: Not on file  Transportation Needs: Not on file  Physical Activity: Not on file  Stress: Not on file  Social Connections: Not on file    Review of Systems: A 12 point ROS discussed and pertinent positives are indicated in the HPI above.  All other systems are negative.  Review of Systems  Constitutional:  Negative for activity change, fatigue and fever.  Respiratory:  Negative for cough and shortness of breath.   Cardiovascular:  Negative for chest pain.  Gastrointestinal:  Negative for abdominal pain, nausea and vomiting.  Musculoskeletal:  Negative for back pain.  Psychiatric/Behavioral:  Negative for behavioral problems and confusion.     Vital Signs: BP 125/87   Pulse 71   Temp 98.2 F (36.8 C) (Oral)   Resp 17   Ht 5\' 10"  (1.778 m)   Wt 200 lb (90.7 kg)   SpO2 96%   BMI 28.70 kg/m   Physical Exam Vitals reviewed.  HENT:     Mouth/Throat:     Mouth: Mucous membranes are moist.  Cardiovascular:     Rate and Rhythm: Normal rate and regular rhythm.     Heart  sounds: Normal heart sounds. No murmur heard. Pulmonary:     Effort: Pulmonary effort is normal.     Breath sounds: Normal breath sounds. No wheezing.  Abdominal:     Palpations: Abdomen is soft.     Tenderness: There is no abdominal tenderness.  Musculoskeletal:        General: Normal range of motion.  Skin:    General: Skin is warm.  Neurological:     Mental Status: He is alert and oriented to person, place, and time.  Psychiatric:        Behavior: Behavior normal.     Imaging: No results found.  Labs:  CBC: Recent Labs    06/05/23 1830 07/14/23 1029  WBC 15.5* 6.2  HGB 14.7 14.6  HCT 41.7 42.4  PLT 333 281    COAGS: Recent Labs    07/14/23 1029  INR 1.0    BMP: Recent Labs  06/05/23 1830  NA 135  K 3.3*  CL 104  CO2 19*  GLUCOSE 111*  BUN 17  CALCIUM 9.1  CREATININE 1.24  GFRNONAA >60    LIVER FUNCTION TESTS: Recent Labs    06/05/23 1830  BILITOT 0.7  AST 21  ALT 28  ALKPHOS 82  PROT 6.8  ALBUMIN 4.1     Assessment and Plan: Left renal stones with hydronephrosis Plans for OR with Dr Claretta Croft 5/8 am for stone removal Plan for Left percutaneous nephrostomy/nephroureteral placement placement Labs reviewed. Risks and benefits of left PCNU placement was discussed with the patient including, but not limited to, infection, bleeding, significant bleeding causing loss or decrease in renal function or damage to adjacent structures.   All of the patient's questions were answered, patient is agreeable to proceed.  Consent signed and in chart.    I spent a total of 30 minutes in face to face in clinical consultation, greater than 50% of which was counseling/coordinating care for Left percutaneous nephrostomy drain placement

## 2023-07-15 ENCOUNTER — Ambulatory Visit (HOSPITAL_BASED_OUTPATIENT_CLINIC_OR_DEPARTMENT_OTHER): Admitting: Registered Nurse

## 2023-07-15 ENCOUNTER — Observation Stay (HOSPITAL_COMMUNITY)
Admission: RE | Admit: 2023-07-15 | Discharge: 2023-07-16 | Disposition: A | Discharge status: Home / Self Care | Payer: Self-pay | Attending: Urology | Admitting: Urology

## 2023-07-15 ENCOUNTER — Ambulatory Visit (HOSPITAL_COMMUNITY)

## 2023-07-15 ENCOUNTER — Encounter (HOSPITAL_COMMUNITY)
Admission: RE | Disposition: A | Discharge status: Home / Self Care | Payer: Self-pay | Source: Home / Self Care | Attending: Urology

## 2023-07-15 ENCOUNTER — Encounter (HOSPITAL_COMMUNITY): Payer: Self-pay | Admitting: Urology

## 2023-07-15 ENCOUNTER — Ambulatory Visit (HOSPITAL_COMMUNITY): Admitting: Registered Nurse

## 2023-07-15 DIAGNOSIS — N200 Calculus of kidney: Secondary | ICD-10-CM | POA: Diagnosis not present

## 2023-07-15 DIAGNOSIS — F17210 Nicotine dependence, cigarettes, uncomplicated: Secondary | ICD-10-CM | POA: Diagnosis not present

## 2023-07-15 HISTORY — PX: NEPHROLITHOTOMY: SHX5134

## 2023-07-15 LAB — CBC
HCT: 44.7 % (ref 39.0–52.0)
Hemoglobin: 14.7 g/dL (ref 13.0–17.0)
MCH: 31.1 pg (ref 26.0–34.0)
MCHC: 32.9 g/dL (ref 30.0–36.0)
MCV: 94.5 fL (ref 80.0–100.0)
Platelets: 264 10*3/uL (ref 150–400)
RBC: 4.73 MIL/uL (ref 4.22–5.81)
RDW: 12.2 % (ref 11.5–15.5)
WBC: 11.9 10*3/uL — ABNORMAL HIGH (ref 4.0–10.5)
nRBC: 0 % (ref 0.0–0.2)

## 2023-07-15 LAB — BASIC METABOLIC PANEL WITH GFR
Anion gap: 8 (ref 5–15)
BUN: 10 mg/dL (ref 6–20)
CO2: 26 mmol/L (ref 22–32)
Calcium: 9.1 mg/dL (ref 8.9–10.3)
Chloride: 100 mmol/L (ref 98–111)
Creatinine, Ser: 1.14 mg/dL (ref 0.61–1.24)
GFR, Estimated: >60 mL/min (ref >60)
Glucose, Bld: 111 mg/dL — ABNORMAL HIGH (ref 70–99)
Potassium: 3.8 mmol/L (ref 3.5–5.1)
Sodium: 134 mmol/L — ABNORMAL LOW (ref 135–145)

## 2023-07-15 SURGERY — NEPHROLITHOTOMY PERCUTANEOUS
Anesthesia: General | Site: Renal | Laterality: Left

## 2023-07-15 MED ORDER — ORAL CARE MOUTH RINSE: 15.0000 mL | Freq: Once | OROMUCOSAL | Status: DC

## 2023-07-15 MED ORDER — CEFAZOLIN SODIUM-DEXTROSE 2-4 GM/100ML-% IV SOLN
INTRAVENOUS | Status: AC
Start: 2023-07-15 — End: 2023-07-15
  Filled 2023-07-15: qty 100

## 2023-07-15 MED ORDER — KETAMINE HCL 50 MG/5ML IJ SOSY
PREFILLED_SYRINGE | INTRAMUSCULAR | Status: DC | PRN
  Administered 2023-07-15: 50 mg via INTRAVENOUS

## 2023-07-15 MED ORDER — SUCCINYLCHOLINE CHLORIDE 200 MG/10ML IV SOSY
PREFILLED_SYRINGE | INTRAVENOUS | Status: DC | PRN
  Administered 2023-07-15: 160 mg via INTRAVENOUS

## 2023-07-15 MED ORDER — OXYCODONE HCL 5 MG PO TABS
5.0000 mg | ORAL_TABLET | Freq: Once | ORAL | Status: AC | PRN
  Administered 2023-07-15: 5 mg via ORAL
  Filled 2023-07-15: qty 1

## 2023-07-15 MED ORDER — KETAMINE HCL 50 MG/5ML IJ SOSY
PREFILLED_SYRINGE | INTRAMUSCULAR | Status: AC
  Filled 2023-07-15: qty 5

## 2023-07-15 MED ORDER — FENTANYL CITRATE (PF) 100 MCG/2ML IJ SOLN
INTRAMUSCULAR | Status: AC
  Filled 2023-07-15: qty 2

## 2023-07-15 MED ORDER — LACTATED RINGERS IV SOLN: INTRAVENOUS | Status: DC | PRN

## 2023-07-15 MED ORDER — ONDANSETRON HCL 4 MG/2ML IJ SOLN: 4.0000 mg | Freq: Once | INTRAMUSCULAR | Status: DC | PRN

## 2023-07-15 MED ORDER — SENNOSIDES-DOCUSATE SODIUM 8.6-50 MG PO TABS
2.0000 | ORAL_TABLET | Freq: Every day | ORAL | Status: DC
Start: 2023-07-15 — End: 2023-07-16
  Administered 2023-07-15: 2 via ORAL
  Filled 2023-07-15: qty 2

## 2023-07-15 MED ORDER — ZOLPIDEM TARTRATE 5 MG PO TABS: 5.0000 mg | ORAL_TABLET | Freq: Every evening | ORAL | Status: DC | PRN

## 2023-07-15 MED ORDER — LACTATED RINGERS IV SOLN: INTRAVENOUS | Status: DC

## 2023-07-15 MED ORDER — CHLORHEXIDINE GLUCONATE 0.12 % MT SOLN
15.0000 mL | Freq: Once | OROMUCOSAL | Status: AC
  Administered 2023-07-15: 15 mL via OROMUCOSAL

## 2023-07-15 MED ORDER — ACETAMINOPHEN 325 MG PO TABS: 650.0000 mg | ORAL_TABLET | ORAL | Status: DC | PRN

## 2023-07-15 MED ORDER — LIDOCAINE 2% (20 MG/ML) 5 ML SYRINGE
INTRAMUSCULAR | Status: AC
  Filled 2023-07-15: qty 5

## 2023-07-15 MED ORDER — OXYCODONE HCL 5 MG/5ML PO SOLN: 5.0000 mg | Freq: Once | ORAL | Status: AC | PRN

## 2023-07-15 MED ORDER — ONDANSETRON HCL 4 MG/2ML IJ SOLN: 4.0000 mg | INTRAMUSCULAR | Status: DC | PRN

## 2023-07-15 MED ORDER — CHLORHEXIDINE GLUCONATE 0.12 % MT SOLN: 15.0000 mL | Freq: Once | OROMUCOSAL | Status: DC

## 2023-07-15 MED ORDER — LIDOCAINE 2% (20 MG/ML) 5 ML SYRINGE
INTRAMUSCULAR | Status: DC | PRN
  Administered 2023-07-15: 100 mg via INTRAVENOUS

## 2023-07-15 MED ORDER — MIDAZOLAM HCL 5 MG/5ML IJ SOLN
INTRAMUSCULAR | Status: DC | PRN
  Administered 2023-07-15: 2 mg via INTRAVENOUS

## 2023-07-15 MED ORDER — SODIUM CHLORIDE 0.9 % IR SOLN
Status: DC | PRN
  Administered 2023-07-15: 3000 mL

## 2023-07-15 MED ORDER — ALBUTEROL SULFATE HFA 108 (90 BASE) MCG/ACT IN AERS
INHALATION_SPRAY | RESPIRATORY_TRACT | Status: DC | PRN
Start: 2023-07-15 — End: 2023-07-15
  Administered 2023-07-15: 6 via RESPIRATORY_TRACT

## 2023-07-15 MED ORDER — ONDANSETRON HCL 4 MG/2ML IJ SOLN
INTRAMUSCULAR | Status: DC | PRN
Start: 2023-07-15 — End: 2023-07-15
  Administered 2023-07-15: 4 mg via INTRAVENOUS

## 2023-07-15 MED ORDER — OXYCODONE HCL 5 MG PO TABS
5.0000 mg | ORAL_TABLET | ORAL | Status: DC | PRN
  Administered 2023-07-15: 5 mg via ORAL
  Filled 2023-07-15: qty 1

## 2023-07-15 MED ORDER — SUGAMMADEX SODIUM 200 MG/2ML IV SOLN
INTRAVENOUS | Status: DC | PRN
  Administered 2023-07-15: 200 mg via INTRAVENOUS

## 2023-07-15 MED ORDER — DIATRIZOATE MEGLUMINE 30 % UR SOLN
URETHRAL | Status: AC
  Filled 2023-07-15: qty 100

## 2023-07-15 MED ORDER — SODIUM CHLORIDE 0.9 % IV SOLN: INTRAVENOUS | Status: DC

## 2023-07-15 MED ORDER — HYDROMORPHONE HCL 1 MG/ML IJ SOLN
INTRAMUSCULAR | Status: AC
  Filled 2023-07-15: qty 0.5

## 2023-07-15 MED ORDER — STERILE WATER FOR IRRIGATION IR SOLN
Status: DC | PRN
  Administered 2023-07-15: 1000 mL

## 2023-07-15 MED ORDER — CEFAZOLIN SODIUM-DEXTROSE 2-4 GM/100ML-% IV SOLN
2.0000 g | Freq: Three times a day (TID) | INTRAVENOUS | Status: AC
  Administered 2023-07-15 – 2023-07-16 (×2): 2 g via INTRAVENOUS
  Filled 2023-07-15 (×2): qty 100

## 2023-07-15 MED ORDER — PROPOFOL 10 MG/ML IV BOLUS
INTRAVENOUS | Status: DC | PRN
  Administered 2023-07-15: 200 mg via INTRAVENOUS

## 2023-07-15 MED ORDER — HYDROMORPHONE HCL 1 MG/ML IJ SOLN
INTRAMUSCULAR | Status: DC | PRN
  Administered 2023-07-15: .5 mg via INTRAVENOUS

## 2023-07-15 MED ORDER — DEXAMETHASONE SODIUM PHOSPHATE 10 MG/ML IJ SOLN
INTRAMUSCULAR | Status: DC | PRN
  Administered 2023-07-15: 10 mg via INTRAVENOUS

## 2023-07-15 MED ORDER — FENTANYL CITRATE (PF) 100 MCG/2ML IJ SOLN
INTRAMUSCULAR | Status: DC | PRN
  Administered 2023-07-15: 100 ug via INTRAVENOUS

## 2023-07-15 MED ORDER — MIDAZOLAM HCL 2 MG/2ML IJ SOLN
INTRAMUSCULAR | Status: AC
  Filled 2023-07-15: qty 2

## 2023-07-15 MED ORDER — ROCURONIUM BROMIDE 100 MG/10ML IV SOLN
INTRAVENOUS | Status: DC | PRN
  Administered 2023-07-15: 50 mg via INTRAVENOUS
  Administered 2023-07-15: 10 mg via INTRAVENOUS

## 2023-07-15 MED ORDER — DIPHENHYDRAMINE HCL 12.5 MG/5ML PO ELIX: 12.5000 mg | ORAL_SOLUTION | Freq: Four times a day (QID) | ORAL | Status: DC | PRN

## 2023-07-15 MED ORDER — DIATRIZOATE MEGLUMINE 30 % UR SOLN
URETHRAL | Status: DC | PRN
  Administered 2023-07-15: 20 mL

## 2023-07-15 MED ORDER — HYDROMORPHONE HCL 1 MG/ML IJ SOLN
0.5000 mg | INTRAMUSCULAR | Status: DC | PRN
  Administered 2023-07-16: 0.5 mg via INTRAVENOUS
  Administered 2023-07-16: 1 mg via INTRAVENOUS
  Administered 2023-07-16: 0.5 mg via INTRAVENOUS
  Administered 2023-07-16: 1 mg via INTRAVENOUS
  Filled 2023-07-15: qty 1
  Filled 2023-07-15: qty 0.5
  Filled 2023-07-15: qty 1
  Filled 2023-07-15: qty 0.5

## 2023-07-15 MED ORDER — DIPHENHYDRAMINE HCL 50 MG/ML IJ SOLN: 12.5000 mg | Freq: Four times a day (QID) | INTRAMUSCULAR | Status: DC | PRN

## 2023-07-15 MED ORDER — CEFAZOLIN SODIUM-DEXTROSE 2-4 GM/100ML-% IV SOLN
2.0000 g | INTRAVENOUS | Status: AC
  Administered 2023-07-15: 2 g via INTRAVENOUS

## 2023-07-15 MED ORDER — FENTANYL CITRATE PF 50 MCG/ML IJ SOSY: 25.0000 ug | PREFILLED_SYRINGE | INTRAMUSCULAR | Status: DC | PRN

## 2023-07-15 SURGICAL SUPPLY — 48 items
BAG URINE DRAIN 2000ML AR STRL (UROLOGICAL SUPPLIES) ×1 IMPLANT
BENZOIN TINCTURE PRP APPL 2/3 (GAUZE/BANDAGES/DRESSINGS) ×2 IMPLANT
BLADE SURG 15 STRL LF DISP TIS (BLADE) ×1 IMPLANT
CATCHER STONE W/TUBE ADAPTER (UROLOGICAL SUPPLIES) ×1 IMPLANT
CATH FOLEY 2W COUNCIL 20FR 5CC (CATHETERS) IMPLANT
CATH FOLEY 2WAY SLVR 5CC 18FR (CATHETERS) ×1 IMPLANT
CATH INTERMIT  6FR 70CM (CATHETERS) ×1 IMPLANT
CATH UROLOGY TORQUE 65 (CATHETERS) ×1 IMPLANT
CATH X-FORCE N30 NEPHROSTOMY (TUBING) ×1 IMPLANT
CHLORAPREP W/TINT 26 (MISCELLANEOUS) IMPLANT
COUNTER NDL MAGNETIC 40 RED (SET/KITS/TRAYS/PACK) ×1 IMPLANT
COUNTER NEEDLE MAGNETIC 40 RED (SET/KITS/TRAYS/PACK) ×1 IMPLANT
COVER LIGHT HANDLE STERIS (MISCELLANEOUS) ×2 IMPLANT
DRAPE C-ARM FOLDED MOBILE STRL (DRAPES) ×1 IMPLANT
DRAPE HALF SHEET 40X57 (DRAPES) ×1 IMPLANT
DRAPE LINGEMAN PERC (DRAPES) ×1 IMPLANT
DRSG PAD ABDOMINAL 8X10 ST (GAUZE/BANDAGES/DRESSINGS) ×2 IMPLANT
DRSG TEGADERM 8X12 (GAUZE/BANDAGES/DRESSINGS) ×3 IMPLANT
GAUZE PAD ABD 8X10 STRL (GAUZE/BANDAGES/DRESSINGS) IMPLANT
GAUZE SPONGE 4X4 12PLY STRL (GAUZE/BANDAGES/DRESSINGS) ×1 IMPLANT
GAUZE SPONGE 4X4 12PLY STRL LF (GAUZE/BANDAGES/DRESSINGS) IMPLANT
GLOVE BIO SURGEON STRL SZ8 (GLOVE) ×1 IMPLANT
GLOVE BIOGEL PI IND STRL 7.0 (GLOVE) ×2 IMPLANT
GLOVE BIOGEL PI IND STRL 8 (GLOVE) IMPLANT
GOWN STRL REUS W/TWL LRG LVL3 (GOWN DISPOSABLE) ×1 IMPLANT
GOWN STRL REUS W/TWL XL LVL3 (GOWN DISPOSABLE) ×1 IMPLANT
GUIDEWIRE AMPLAZ .035X145 (WIRE) ×1 IMPLANT
GUIDEWIRE STR DUAL SENSOR (WIRE) ×1 IMPLANT
IV NS 500ML BAXH (IV SOLUTION) ×1 IMPLANT
KIT PROBE TRILOGY 3.9X350 (MISCELLANEOUS) ×1 IMPLANT
KIT TURNOVER KIT A (KITS) ×1 IMPLANT
MANIFOLD NEPTUNE II (INSTRUMENTS) ×1 IMPLANT
PACK CYSTO (CUSTOM PROCEDURE TRAY) ×1 IMPLANT
PAD ARMBOARD POSITIONER FOAM (MISCELLANEOUS) ×1 IMPLANT
POSITIONER FOAM HEAD TRAC HOLE (MISCELLANEOUS) ×1 IMPLANT
POSITIONER HEAD 8X9X4 ADT (SOFTGOODS) ×1 IMPLANT
SET BASIN LINEN APH (SET/KITS/TRAYS/PACK) ×1 IMPLANT
SHEATH COOK PEEL AWAY SET 9F (SHEATH) IMPLANT
SHEATH PEELAWAY SET 9 (SHEATH) ×1 IMPLANT
SOL .9 NS 3000ML IRR UROMATIC (IV SOLUTION) ×2 IMPLANT
SPONGE DRAIN TRACH 4X4 STRL 2S (GAUZE/BANDAGES/DRESSINGS) ×1 IMPLANT
STENT URET 6FRX26 CONTOUR (STENTS) IMPLANT
STONE CATCHER W/TUBE ADAPTER (UROLOGICAL SUPPLIES) ×1 IMPLANT
SUT SILK 2 0 SH (SUTURE) ×1 IMPLANT
SYR 50ML LL SCALE MARK (SYRINGE) ×1 IMPLANT
TRAY FOLEY W/BAG SLVR 16FR ST (SET/KITS/TRAYS/PACK) ×1 IMPLANT
TUBE CONNECTING 12X1/4 (SUCTIONS) ×2 IMPLANT
WATER STERILE IRR 1000ML POUR (IV SOLUTION) ×1 IMPLANT

## 2023-07-15 NOTE — Progress Notes (Signed)
   07/15/23 2016  TOC Brief Assessment  Insurance and Status Reviewed  Patient has primary care physician Yes  Home environment has been reviewed From home  Prior level of function: Independent  Prior/Current Home Services No current home services  Social Drivers of Health Review SDOH reviewed interventions complete (smoking cessation added to AVS)  Readmission risk has been reviewed Yes  Transition of care needs no transition of care needs at this time   Pt admitted for obs after left percutaneous nephrostolithotomy.

## 2023-07-15 NOTE — Transfer of Care (Signed)
 Immediate Anesthesia Transfer of Care Note  Patient: Wayne Peterson  Procedure(s) Performed: NEPHROLITHOTOMY PERCUTANEOUS (Left: Renal)  Patient Location: PACU  Anesthesia Type:Regional  Level of Consciousness: awake, alert , and oriented  Airway & Oxygen Therapy: Patient Spontanous Breathing and Patient connected to face mask oxygen  Post-op Assessment: Report given to RN and Post -op Vital signs reviewed and stable  Post vital signs: Reviewed and stable  Last Vitals:  Vitals Value Taken Time  BP 128/73 07/15/23 1406  Temp    Pulse 94 07/15/23 1410  Resp 16 07/15/23 1410  SpO2 100 % 07/15/23 1410  Vitals shown include unfiled device data.  Last Pain:  Vitals:   07/15/23 1036  PainSc: 4          Complications: No notable events documented.

## 2023-07-15 NOTE — H&P (Signed)
 History of Present Illness: Wayne Peterson is a 48 y.o. male who presents today as a new patient at [facility] [place]. All available relevant medical records have been reviewed.  - GU History: 1. Kidney stone(s). - 08/26/2018: Underwent left ureteroscopic stone manipulation by Dr. Secundino Dach. - 04/19/2019: CT showed a non-obstructing 4 mm left kidney stone.   Today: He reports intermittent left flank / left low back pain for the past 6 months. He states the pain is dull most of the time however he did have one episode last week when the pain was severe for about 20-30 minutes. Over the past 6 months he has also been having some defecatory dysfunction for which he has been evaluated by GI including a colonoscopy in October 2024 which he reports was unremarkable. Denies abdominal pain or known passage of stones recently.   He denies increased urinary urgency, frequency, nocturia, dysuria, gross hematuria, hesitancy, straining to void, or sensations of incomplete emptying.     Fall Screening: Do you usually have a device to assist in your mobility? No    Medications:       Current Outpatient Medications  Medication Sig Dispense Refill   Cholecalciferol (VITAMIN D3) 50 MCG (2000 UT) TABS Take 4,000 mg by mouth at bedtime.       Multiple Vitamin (MULTIVITAMIN WITH MINERALS) TABS tablet Take 1 tablet by mouth at bedtime.       Probiotic Product (PROBIOTIC PO) Take 1 capsule by mouth at bedtime.       Plecanatide  (TRULANCE ) 3 MG TABS Take 1 tablet (3 mg total) by mouth daily. (Patient not taking: Reported on 03/31/2023) 30 tablet 11   tamsulosin  (FLOMAX ) 0.4 MG CAPS capsule Take 1 capsule (0.4 mg total) by mouth daily for 14 days. 14 capsule 0      No current facility-administered medications for this visit.        Allergies: Allergies       Allergies  Allergen Reactions   Penicillins Nausea And Vomiting      Did it involve swelling of the face/tongue/throat, SOB, or low BP? No Did it  involve sudden or severe rash/hives, skin peeling, or any reaction on the inside of your mouth or nose? No Did you need to seek medical attention at a hospital or doctor's office? No When did it last happen? Teenager If all above answers are "NO", may proceed with cephalosporin use.              Past Medical History:  Diagnosis Date   Colon polyps 06/16/2019    Nine 5-7 mm polyps, two 18-22 mm polyps, pathology with multiple tubular adenomas without high-grade dysplasia, also hyperplastic rectal polyps   Diverticulitis     History of kidney stones               Past Surgical History:  Procedure Laterality Date   COLONOSCOPY N/A 06/16/2019    Procedure: COLONOSCOPY;  Surgeon: Suzette Espy, MD; diverticulosis in sigmoid and descending colon, nine 5-7 mm polyps, two 18-22 mm polyps resected in piecemeal fashion s/p clip placement.  Pathology with multiple tubular adenomas without high-grade dysplasia as well as hyperplastic rectal polyps.  Repeat colonoscopy in 6 months.   COLONOSCOPY N/A 03/20/2020    Procedure: COLONOSCOPY;  Surgeon: Suzette Espy, MD;  [facility];  Service: Endoscopy;  Laterality: N/A;  12:00   COLONOSCOPY WITH PROPOFOL  N/A 01/14/2023    Procedure: COLONOSCOPY WITH PROPOFOL ;  Surgeon: Suzette Espy,  MD;  Location: [facility];  Service: Endoscopy;  Laterality: N/A;  9:00 am, asa 2   CYSTOSCOPY WITH RETROGRADE PYELOGRAM, URETEROSCOPY AND STENT PLACEMENT   08/26/2018    Procedure: CYSTOSCOPY WITH BILATERAL RETROGRADE PYELOGRAM, LEFT URETEROSCOPY AND LEFT STENT PLACEMENT;  Surgeon: Osborn Blaze, MD;  Location: [facility];  Service: Urology;;   HOLMIUM LASER APPLICATION Left 08/26/2018    Procedure: HOLMIUM LASER APPLICATION;  Surgeon: Osborn Blaze, MD;  Location: [facility];  Service: Urology;  Laterality: Left;   POLYPECTOMY   03/20/2020    Procedure: POLYPECTOMY INTESTINAL;  Surgeon: Suzette Espy, MD;  Location:  [facility];  Service: Endoscopy;;   POLYPECTOMY   01/14/2023    Procedure: POLYPECTOMY;  Surgeon: Suzette Espy, MD;  [facility]: [facility];  Service: Endoscopy;;   WISDOM TOOTH EXTRACTION                 Family History  Problem Relation Age of Onset   Cancer Mother     Cirrhosis Father     Colon polyps Maternal Grandfather     Colon cancer Neg Hx          Social History         Socioeconomic History   Marital status: Married      Spouse name: Not on file   Number of children: Not on file   Years of education: Not on file   Highest education level: Not on file  Occupational History   Not on file  Tobacco Use   Smoking status: Every Day      Current packs/day: 0.50      Average packs/day: 0.5 packs/day for 23.0 years (11.5 ttl pk-yrs)      Types: Cigarettes   Smokeless tobacco: Never  Vaping Use   Vaping status: Never Used  Substance and Sexual Activity   Alcohol use: Not Currently   Drug use: Yes      Types: Marijuana      Comment: marijuana "hemp" over the weekend   Sexual activity: Not Currently  Other Topics Concern   Not on file  Social History Narrative   Not on file    Social Drivers of Health    Financial Resource Strain: Not on file  Food Insecurity: Not on file  Transportation Needs: Not on file  Physical Activity: Not on file  Stress: Not on file  Social Connections: Not on file  Intimate Partner Violence: Not on file      SUBJECTIVE   Review of Systems Constitutional: Patient denies any unintentional weight loss or change in strength lntegumentary: Patient denies any rashes or pruritus Cardiovascular: Patient denies chest pain or syncope Respiratory: Patient denies shortness of breath Gastrointestinal: Patient denies nausea, vomiting, constipation, or diarrhea Musculoskeletal: Patient denies muscle cramps or weakness Neurologic: Patient denies convulsions or seizures Allergic/Immunologic: Patient denies recent allergic  reaction(s) Hematologic/Lymphatic: Patient denies bleeding tendencies Endocrine: Patient denies heat/cold intolerance   GU: As per HPI.   OBJECTIVE    Vitals:    03/31/23 0842  BP: 113/71  Pulse: 77  Temp: 98.3 F (36.8 C)    There is no height or weight on file to calculate BMI.   Physical Examination Constitutional: No obvious distress; patient is non-toxic appearing  Cardiovascular: No visible lower extremity edema.  Respiratory: The patient does not have audible wheezing/stridor; respirations do not appear labored  Gastrointestinal: Abdomen non-distended Musculoskeletal: Normal ROM of UEs  Skin: No obvious rashes/open sores  Neurologic: CN 2-12 grossly intact Psychiatric: Answered questions appropriately with normal affect  Hematologic/Lymphatic/Immunologic: No obvious bruises or sites of spontaneous bleeding   Urine microscopy: 6-10 WBC/hpf, >30 RBC/hpf, few bacteria, calcium oxalate crystals present     ASSESSMENT and plan Kidney stones - -We discussed the management of kidney stones. These options include observation, ureteroscopy, shockwave lithotripsy (ESWL) and percutaneous nephrolithotomy (PCNL). We discussed which options are relevant to the patient's stone(s). We discussed the natural history of kidney stones as well as the complications of untreated stones and the impact on quality of life without treatment as well as with each of the above listed treatments. We also discussed the efficacy of each treatment in its ability to clear the stone burden. With any of these management options I discussed the signs and symptoms of infection and the need for emergent treatment should these be experienced. For each option we discussed the ability of each procedure to clear the patient of their stone burden.   For observation I described the risks which include but are not limited to silent renal damage, life-threatening infection, need for emergent surgery, failure to pass stone  and pain.   For ureteroscopy I described the risks which include bleeding, infection, damage to contiguous structures, positioning injury, ureteral stricture, ureteral avulsion, ureteral injury, need for prolonged ureteral stent, inability to perform ureteroscopy, need for an interval procedure, inability to clear stone burden, stent discomfort/pain, heart attack, stroke, pulmonary embolus and the inherent risks with general anesthesia.   For shockwave lithotripsy I described the risks which include arrhythmia, kidney contusion, kidney hemorrhage, need for transfusion, pain, inability to adequately break up stone, inability to pass stone fragments, Steinstrasse, infection associated with obstructing stones, need for alternate surgical procedure, need for repeat shockwave lithotripsy, MI, CVA, PE and the inherent risks with anesthesia/conscious sedation.   For PCNL I described the risks including positioning injury, pneumothorax, hydrothorax, need for chest tube, inability to clear stone burden, renal laceration, arterial venous fistula or malformation, need for embolization of kidney, loss of kidney or renal function, need for repeat procedure, need for prolonged nephrostomy tube, ureteral avulsion, MI, CVA, PE and the inherent risks of general anesthesia.   - The patient would like to proceed with left PCNL

## 2023-07-15 NOTE — Plan of Care (Signed)

## 2023-07-15 NOTE — Op Note (Signed)
 Preoperative diagnosis: Left renal stone  Postoperative diagnosis: Same  Procedure 1.  Left percutaneous nephrostolithotomy for stone less than 2 cm 2.  Left nephrostogram 3.  Intraoperative fluoroscopy, under 1 hour, with interpretation 4.  Placement of a 6 x 26 double-J ureteral stent. 5.  Left ureteroscopy with lithotripsy and stone extraction 6.  Placement of a 20 French nephrostomy tube 7.  Dilation of percutaneous tract  Attending: Dr. Claretta Croft  Anesthesia: General  Estimated blood loss: Minimal  Antibiotics: ancef  Drains: 1.  16 French Foley catheter 2.  6 x 26 left double-J ureteral stent 3.  20 French nephrostomy tube  Specimens: Stone for analysis  Findings: 3 renal pelvis calculi. Likely left UPJ obstruction  Indications: Patient is a 48 year old male with a history of large left renal stones.  After discussing treatment options and decided she was left percutaneous nephrostolithotomy.  Patient already has a nephrostomy tube.  Procedure in detail: Prior to procedure consent was obtained.  Patient was brought to the operating room debridement was done to ensure correct patient, correct procedure, and correct site.  General anesthesia was administered.  A 16 French Foley catheter was in place.  The patient was then placed in the prone position.  His nephrostomy tube and left flank was then prepped and draped in usual sterile fashion.  A nephrostogram was obtained and findings noted above.  Through the nephrostomy tube we then placed a sensor wire.  Sensor wire was coiled in the renal pelvis we then removed the nephrostomy tube.  We then made an incision at the level of the skin and over the wire we then placed a NephroMax dilator.  We dilated the nephrostomy tract to 30 [place] and held this 18 cm of water  for 1 minute.  We then  placed the access sheath over the balloon.  The balloon was then deflated.  We then used a rigid nephroscope to perform nephroscopy.  We encountered a  large renal pelvis stones that were in the renal pelvis. We used the Trilogy to fragment the stones. The fragments were removed with graspers. Once the majority of the stone was removed we then were able to perform ureteroscopy with a flexible ureteroscope.  We advanced ureteroscope down to the level of bladder.  We then placed a second wire through the ureteroscope into the bladder.  We then removed the ureteroscope and over the wire placed a 6 x 26 double-J ureteral stent.  The wire was then removed and good coil was noted in the renal pelvis under direct vision in the bladder under fluoroscopy.  We then placed a 20 French nephrostomy tube through the sheath into the renal pelvis.  The balloon was inflated with 3 amounts of contrast.  We then removed the access sheath in and obtain another nephrostogram.  We noted minimal extravasation of contrast.  We then secured the nephrostomy tubes with 0 silks in interrupted fashion.  Dressing was placed over the nephrostomy tube site and this then concluded the procedure was well-tolerated by the patient.  Complications: None  Condition: Stable, extubated, transferred to PACU  Plan: Patient is to be admitted overnight for observation.  His Foley catheter through the morning.  He is then to be discharged home and followup in 3 days for nephrostomy tube removal and 10 days for stent removal

## 2023-07-15 NOTE — Anesthesia Preprocedure Evaluation (Signed)
 Anesthesia Evaluation  Patient identified by MRN, date of birth, ID band Patient awake    Reviewed: Allergy & Precautions, H&P , NPO status , Patient's Chart, lab work & pertinent test results, reviewed documented beta blocker date and time   Airway Mallampati: II  TM Distance: >3 FB Neck ROM: full    Dental no notable dental hx.    Pulmonary neg pulmonary ROS, Current Smoker and Patient abstained from smoking.   Pulmonary exam normal breath sounds clear to auscultation       Cardiovascular Exercise Tolerance: Good hypertension, negative cardio ROS  Rhythm:regular Rate:Normal     Neuro/Psych negative neurological ROS  negative psych ROS   GI/Hepatic negative GI ROS, Neg liver ROS,,,  Endo/Other  negative endocrine ROS    Renal/GU Renal disease  negative genitourinary   Musculoskeletal   Abdominal   Peds  Hematology negative hematology ROS (+)   Anesthesia Other Findings   Reproductive/Obstetrics negative OB ROS                             Anesthesia Physical Anesthesia Plan  ASA: 2  Anesthesia Plan: General and General ETT   Post-op Pain Management:    Induction:   PONV Risk Score and Plan: Ondansetron   Airway Management Planned:   Additional Equipment:   Intra-op Plan:   Post-operative Plan:   Informed Consent: I have reviewed the patients History and Physical, chart, labs and discussed the procedure including the risks, benefits and alternatives for the proposed anesthesia with the patient or authorized representative who has indicated his/her understanding and acceptance.     Dental Advisory Given  Plan Discussed with: CRNA  Anesthesia Plan Comments:        Anesthesia Quick Evaluation

## 2023-07-15 NOTE — Anesthesia Procedure Notes (Signed)
 Procedure Name: Intubation Date/Time: 07/15/2023 1:06 PM  Performed by: Koreen Person, CRNAPre-anesthesia Checklist: Patient identified, Emergency Drugs available, Suction available, Patient being monitored and Timeout performed Patient Re-evaluated:Patient Re-evaluated prior to induction Oxygen Delivery Method: Circle system utilized Preoxygenation: Pre-oxygenation with 100% oxygen Induction Type: IV induction Laryngoscope Size: Mac and 4 Grade View: Grade I Tube type: Oral Tube size: 7.5 mm Airway Equipment and Method: Stylet Placement Confirmation: ETT inserted through vocal cords under direct vision, positive ETCO2, CO2 detector and breath sounds checked- equal and bilateral Secured at: 21 cm Tube secured with: Tape Dental Injury: Teeth and Oropharynx as per pre-operative assessment

## 2023-07-15 NOTE — Plan of Care (Signed)

## 2023-07-16 ENCOUNTER — Encounter (HOSPITAL_COMMUNITY): Payer: Self-pay | Admitting: Urology

## 2023-07-16 ENCOUNTER — Telehealth: Payer: Self-pay | Admitting: Urology

## 2023-07-16 ENCOUNTER — Telehealth: Payer: Self-pay

## 2023-07-16 DIAGNOSIS — N200 Calculus of kidney: Secondary | ICD-10-CM | POA: Diagnosis not present

## 2023-07-16 LAB — BASIC METABOLIC PANEL WITH GFR
Anion gap: 8 (ref 5–15)
BUN: 11 mg/dL (ref 6–20)
CO2: 27 mmol/L (ref 22–32)
Calcium: 8.8 mg/dL — ABNORMAL LOW (ref 8.9–10.3)
Chloride: 102 mmol/L (ref 98–111)
Creatinine, Ser: 0.94 mg/dL (ref 0.61–1.24)
GFR, Estimated: >60 mL/min (ref >60)
Glucose, Bld: 123 mg/dL — ABNORMAL HIGH (ref 70–99)
Potassium: 4.2 mmol/L (ref 3.5–5.1)
Sodium: 137 mmol/L (ref 135–145)

## 2023-07-16 LAB — CBC
HCT: 40.8 % (ref 39.0–52.0)
Hemoglobin: 13.7 g/dL (ref 13.0–17.0)
MCH: 32 pg (ref 26.0–34.0)
MCHC: 33.6 g/dL (ref 30.0–36.0)
MCV: 95.3 fL (ref 80.0–100.0)
Platelets: 289 10*3/uL (ref 150–400)
RBC: 4.28 MIL/uL (ref 4.22–5.81)
RDW: 12.4 % (ref 11.5–15.5)
WBC: 13.6 10*3/uL — ABNORMAL HIGH (ref 4.0–10.5)
nRBC: 0 % (ref 0.0–0.2)

## 2023-07-16 MED ORDER — ONDANSETRON 4 MG PO TBDP: ORAL_TABLET | ORAL | 0 refills | Status: DC

## 2023-07-16 MED ORDER — OXYCODONE-ACETAMINOPHEN 5-325 MG PO TABS: 1.0000 | ORAL_TABLET | Freq: Four times a day (QID) | ORAL | 0 refills | Status: DC | PRN

## 2023-07-16 NOTE — Telephone Encounter (Signed)
 Patient called wanting to know when he can shower.  Verbal from MD ok to shower no soaking in the tub.  Patient voiced understanding.

## 2023-07-16 NOTE — Telephone Encounter (Signed)
 Left message that he had surgery and his tube in his back is coming out, the guaze is leaking and getting worse.

## 2023-07-16 NOTE — Progress Notes (Signed)
 Nsg Discharge Note  Admit Date:  07/15/2023 Discharge date: 07/16/2023   Wayne Peterson II to be D/C'd Home per MD order.  AVS completed. Patient/caregiver able to verbalize understanding.  Discharge Medication: Allergies as of 07/16/2023       Reactions   Penicillins Nausea And Vomiting   Did it involve swelling of the face/tongue/throat, SOB, or low BP? No Did it involve sudden or severe rash/hives, skin peeling, or any reaction on the inside of your mouth or nose? No Did you need to seek medical attention at a hospital or doctor's office? No When did it last happen? Teenager If all above answers are "NO", may proceed with cephalosporin use.        Medication List     STOP taking these medications    Vitamin D3 50 MCG (2000 UT) Tabs       TAKE these medications    ibuprofen 200 MG tablet Commonly known as: ADVIL Take 400 mg by mouth every 8 (eight) hours as needed (pain.).   multivitamin with minerals Tabs tablet Take 1 tablet by mouth at bedtime.   ondansetron  4 MG disintegrating tablet Commonly known as: ZOFRAN -ODT 4mg  ODT q4 hours prn nausea/vomit   oxyCODONE -acetaminophen  5-325 MG tablet Commonly known as: PERCOCET/ROXICET Take 1 tablet by mouth every 6 (six) hours as needed for severe pain (pain score 7-10).   oxyCODONE -acetaminophen  5-325 MG tablet Commonly known as: PERCOCET/ROXICET Take 1 tablet by mouth every 6 (six) hours as needed for severe pain (pain score 7-10).   PROBIOTIC PO Take 1 capsule by mouth at bedtime.   sulfamethoxazole -trimethoprim  800-160 MG tablet Commonly known as: BACTRIM  DS Take 1 tablet by mouth 2 (two) times daily.        Discharge Assessment: Vitals:   07/16/23 0021 07/16/23 0414  BP: 112/71 119/79  Pulse: 84 86  Resp: 18 16  Temp: 98.6 F (37 C) 97.6 F (36.4 C)  SpO2: 96% 95%   Skin clean, dry and intact without evidence of skin break down, no evidence of skin tears noted. IV catheter discontinued intact. Site  without signs and symptoms of complications - no redness or edema noted at insertion site, patient denies c/o pain - only slight tenderness at site.  Dressing with slight pressure applied.  D/c Instructions-Education: Discharge instructions given to patient/family with verbalized understanding. D/c education completed with patient/family including follow up instructions, medication list, d/c activities limitations if indicated, with other d/c instructions as indicated by MD - patient able to verbalize understanding, all questions fully answered. Patient instructed to return to ED, call 911, or call MD for any changes in condition.  Patient escorted via WC, and D/C home via private auto.  Lilton Relic, RN 07/16/2023 10:33 AM

## 2023-07-16 NOTE — Plan of Care (Signed)

## 2023-07-16 NOTE — Telephone Encounter (Signed)
 I called and spoke with patient, his tubes are not coming out of his back he just wanted to make sure that the drainage is normal after surgery.  Patient states the spot is about the size of the quarter and is pink in color.  Patient denies any drainage signaling infection, no fever, or worsening redness of the area.  Patient advised to be seen at the ER or urgent care if he notices any sign of infection.  He also asked if he can take 2 of his pain medications at a time, I informed him to take the medication as prescribed by MD and if his pain did not improved to go to the ER or urgent care.  Patient voiced understanding.

## 2023-07-16 NOTE — Plan of Care (Signed)
  Problem: Education: Goal: Knowledge of General Education information will improve Description: Including pain rating scale, medication(s)/side effects and non-pharmacologic comfort measures Outcome: Progressing   Problem: Clinical Measurements: Goal: Ability to maintain clinical measurements within normal limits will improve Outcome: Progressing   Problem: Activity: Goal: Risk for activity intolerance will decrease Outcome: Progressing   Problem: Nutrition: Goal: Adequate nutrition will be maintained Outcome: Progressing   Problem: Pain Managment: Goal: General experience of comfort will improve and/or be controlled Outcome: Progressing

## 2023-07-17 ENCOUNTER — Emergency Department (HOSPITAL_COMMUNITY)
Admission: EM | Admit: 2023-07-17 | Discharge: 2023-07-17 | Disposition: A | Discharge status: Home / Self Care | Attending: Emergency Medicine | Admitting: Emergency Medicine

## 2023-07-17 ENCOUNTER — Ambulatory Visit: Admission: EM | Admit: 2023-07-17 | Discharge: 2023-07-17 | Disposition: A | Discharge status: Home / Self Care

## 2023-07-17 ENCOUNTER — Encounter (HOSPITAL_COMMUNITY): Payer: Self-pay

## 2023-07-17 ENCOUNTER — Other Ambulatory Visit: Payer: Self-pay

## 2023-07-17 DIAGNOSIS — Z4800 Encounter for change or removal of nonsurgical wound dressing: Secondary | ICD-10-CM | POA: Insufficient documentation

## 2023-07-17 DIAGNOSIS — Z5189 Encounter for other specified aftercare: Secondary | ICD-10-CM

## 2023-07-17 DIAGNOSIS — Z4801 Encounter for change or removal of surgical wound dressing: Secondary | ICD-10-CM | POA: Diagnosis not present

## 2023-07-17 LAB — CBC WITH DIFFERENTIAL/PLATELET
Abs Immature Granulocytes: 0.04 10*3/uL (ref 0.00–0.07)
Basophils Absolute: 0.1 10*3/uL (ref 0.0–0.1)
Basophils Relative: 1 %
Eosinophils Absolute: 0.1 10*3/uL (ref 0.0–0.5)
Eosinophils Relative: 1 %
HCT: 42.8 % (ref 39.0–52.0)
Hemoglobin: 14.1 g/dL (ref 13.0–17.0)
Immature Granulocytes: 0 %
Lymphocytes Relative: 27 %
Lymphs Abs: 2.7 10*3/uL (ref 0.7–4.0)
MCH: 31.5 pg (ref 26.0–34.0)
MCHC: 32.9 g/dL (ref 30.0–36.0)
MCV: 95.5 fL (ref 80.0–100.0)
Monocytes Absolute: 1.4 10*3/uL — ABNORMAL HIGH (ref 0.1–1.0)
Monocytes Relative: 14 %
Neutro Abs: 5.9 10*3/uL (ref 1.7–7.7)
Neutrophils Relative %: 57 %
Platelets: 282 10*3/uL (ref 150–400)
RBC: 4.48 MIL/uL (ref 4.22–5.81)
RDW: 12.3 % (ref 11.5–15.5)
WBC: 10.3 10*3/uL (ref 4.0–10.5)
nRBC: 0 % (ref 0.0–0.2)

## 2023-07-17 LAB — BASIC METABOLIC PANEL WITH GFR
Anion gap: 8 (ref 5–15)
BUN: 13 mg/dL (ref 6–20)
CO2: 26 mmol/L (ref 22–32)
Calcium: 9.1 mg/dL (ref 8.9–10.3)
Chloride: 101 mmol/L (ref 98–111)
Creatinine, Ser: 0.94 mg/dL (ref 0.61–1.24)
GFR, Estimated: >60 mL/min (ref >60)
Glucose, Bld: 99 mg/dL (ref 70–99)
Potassium: 3.8 mmol/L (ref 3.5–5.1)
Sodium: 135 mmol/L (ref 135–145)

## 2023-07-17 NOTE — Discharge Instructions (Addendum)
 If you develop new or worsening pain, fever, or any other new/concerning symptoms then return to the ER.

## 2023-07-17 NOTE — ED Triage Notes (Signed)
 Pt to er, pt states that he had kidney stone surgery on Thursday, states that he has a tube in his back with a dressing and the dressing needs to be changed, states that the called his doc and was told to go to urgent care, but they told him they didn't have the supplies and to go to the er.

## 2023-07-17 NOTE — ED Notes (Signed)
 Dressings applied and covered with multiple waterproof dressing covers.

## 2023-07-17 NOTE — ED Provider Notes (Signed)
 [place] [facility]ERGENCY DEPARTMENT AT [facility] Provider Note   CSN: [id number] Arrival date & time: 07/17/23  1730     History  Chief Complaint  Patient presents with   Dressing Change    Wayne Peterson is a 48 y.o. male.  HPI 48 year old male present for a wound check and dressing change. 2 days ago he had left sided nephrostolithotomy, ureteral stent placement and lithotripsy performed by Dr. Claretta Croft.  Left the hospital yesterday and states he had a little bit of drainage on the dressing.  The drainage is a lot worse today.  He has been taking the pain meds but they have been controlling his pain.  He does not feel his pain is out of control.  He seems to be having normal urine output out of the tube.  No blood in the tube drainage or when he urinates.  No fevers.  He called the on-call nurse for urology and they advised him to come to the ER for dressing change and to evaluate for possible infection.  Home Medications Prior to Admission medications   Medication Sig Start Date End Date Taking? Authorizing Provider  ibuprofen (ADVIL) 200 MG tablet Take 400 mg by mouth every 8 (eight) hours as needed (pain.).    [provider]  Multiple Vitamin (MULTIVITAMIN WITH MINERALS) TABS tablet Take 1 tablet by mouth at bedtime.    [provider]  ondansetron  (ZOFRAN -ODT) 4 MG disintegrating tablet 4mg  ODT q4 hours prn nausea/vomit 07/16/23   McKenzie, Arden Beck, MD  oxyCODONE -acetaminophen  (PERCOCET/ROXICET) 5-325 MG tablet Take 1 tablet by mouth every 6 (six) hours as needed for severe pain (pain score 7-10). 07/16/23   McKenzie, Arden Beck, MD  oxyCODONE -acetaminophen  (PERCOCET/ROXICET) 5-325 MG tablet Take 1 tablet by mouth every 6 (six) hours as needed for severe pain (pain score 7-10). 07/16/23   McKenzie, Arden Beck, MD  Probiotic Product (PROBIOTIC PO) Take 1 capsule by mouth at bedtime.    [provider]  sulfamethoxazole -trimethoprim  (BACTRIM  DS)  800-160 MG tablet Take 1 tablet by mouth 2 (two) times daily. 06/30/23   McKenzie, Arden Beck, MD      Allergies    Penicillins    Review of Systems   Review of Systems  Constitutional:  Negative for fever.  Genitourinary:  Negative for hematuria.    Physical Exam Updated Vital Signs BP 136/85   Pulse 92   Temp 98 F (36.7 C) (Oral)   Resp 15   Ht 5\' 10"  (1.778 m)   Wt 90.7 kg   SpO2 98%   BMI 28.70 kg/m  Physical Exam Vitals and nursing note reviewed.  Constitutional:      General: He is not in acute distress.    Appearance: He is well-developed. He is not ill-appearing or diaphoretic.  HENT:     Head: Normocephalic and atraumatic.  Cardiovascular:     Rate and Rhythm: Normal rate and regular rhythm.  Pulmonary:     Effort: Pulmonary effort is normal.  Abdominal:     General: There is no distension.     Tenderness: There is no right CVA tenderness or left CVA tenderness.  Musculoskeletal:       Arms:  Skin:    General: Skin is warm and dry.  Neurological:     Mental Status: He is alert.     ED Results / Procedures / Treatments   Labs (all labs ordered are listed, but only abnormal results are displayed)  Labs Reviewed  CBC WITH DIFFERENTIAL/PLATELET - Abnormal; Notable for the following components:      Result Value   Monocytes Absolute 1.4 (*)    All other components within normal limits  BASIC METABOLIC PANEL WITH GFR    EKG None  Radiology No results found.  Procedures Procedures    Medications Ordered in ED Medications - No data to display  ED Course/ Medical Decision Making/ A&P                                 Medical Decision Making Amount and/or Complexity of Data Reviewed External Data Reviewed: notes. Labs: ordered.    Details: Normal hemoglobin, unremarkable renal function   Patient's wound appears clean/dry/intact.  Dressings were obtained from the ICU and applied to his surgical wound.  Otherwise his vitals are reassuring  and he is well-appearing.  I doubt infection as the cause of his drainage.  He appears stable to follow-up with urology in 2 days for a wound check and postoperative check.  Will give return precautions.        Final Clinical Impression(s) / ED Diagnoses Final diagnoses:  Dressing change  Visit for wound check    Rx / DC Orders ED Discharge Orders     None         Jerilynn Montenegro, MD 07/17/23 2029

## 2023-07-19 ENCOUNTER — Ambulatory Visit (INDEPENDENT_AMBULATORY_CARE_PROVIDER_SITE_OTHER): Admitting: Urology

## 2023-07-19 ENCOUNTER — Telehealth: Payer: Self-pay | Admitting: Urology

## 2023-07-19 VITALS — BP 112/70 | HR 64

## 2023-07-19 DIAGNOSIS — N132 Hydronephrosis with renal and ureteral calculous obstruction: Secondary | ICD-10-CM

## 2023-07-19 NOTE — Progress Notes (Signed)
 07/19/2023 11:57 AM   Wayne Peterson [birth year] [id number]  Referring provider: No referring provider defined for this encounter.  nephrolithiasis   HPI: Mr Wayne Peterson is a 48yo here for followup after left PCNl.    PMH: Past Medical History:  Diagnosis Date   Colon polyps 06/16/2019   Nine 5-7 mm polyps, two 18-22 mm polyps, pathology with multiple tubular adenomas without high-grade dysplasia, also hyperplastic rectal polyps   Diverticulitis    History of kidney stones     Surgical History: Past Surgical History:  Procedure Laterality Date   COLONOSCOPY N/A 06/16/2019   Procedure: COLONOSCOPY;  Surgeon: Suzette Espy, MD; diverticulosis in sigmoid and descending colon, nine 5-7 mm polyps, two 18-22 mm polyps resected in piecemeal fashion s/p clip placement.  Pathology with multiple tubular adenomas without high-grade dysplasia as well as hyperplastic rectal polyps.  Repeat colonoscopy in 6 months.   COLONOSCOPY N/A 03/20/2020   Procedure: COLONOSCOPY;  Surgeon: Suzette Espy, MD;  Location: [facility];  Service: Endoscopy;  Laterality: N/A;  12:00   COLONOSCOPY WITH PROPOFOL  N/A 01/14/2023   Procedure: COLONOSCOPY WITH PROPOFOL ;  Surgeon: Suzette Espy, MD;  Location: [facility];  Service: Endoscopy;  Laterality: N/A;  9:00 am, asa 2   CYSTOSCOPY WITH RETROGRADE PYELOGRAM, URETEROSCOPY AND STENT PLACEMENT  08/26/2018   Procedure: CYSTOSCOPY WITH BILATERAL RETROGRADE PYELOGRAM, LEFT URETEROSCOPY AND LEFT STENT PLACEMENT;  Surgeon: Osborn Blaze, MD;  Location: [facility];  Service: Urology;;   HOLMIUM LASER APPLICATION Left 08/26/2018   Procedure: HOLMIUM LASER APPLICATION;  Surgeon: Osborn Blaze, MD;  [facility];  Service: Urology;  Laterality: Left;   IR  NEPHROURETERAL CATH PLACE LEFT  07/14/2023   NEPHROLITHOTOMY Left 07/15/2023   Procedure: NEPHROLITHOTOMY PERCUTANEOUS;  Surgeon: Marco Severs, MD;  Location: [facility];  Service: Urology;  Laterality: Left;   POLYPECTOMY  03/20/2020   Procedure: POLYPECTOMY INTESTINAL;  Surgeon: Suzette Espy, MD;  [facility]: [facility];  Service: Endoscopy;;   POLYPECTOMY  01/14/2023   Procedure: POLYPECTOMY;  Surgeon: Suzette Espy, MD;  Location: [facility];  Service: Endoscopy;;   WISDOM TOOTH EXTRACTION      Home Medications:  Allergies as of 07/19/2023       Reactions   Penicillins Nausea And Vomiting   Did it involve swelling of the face/tongue/throat, SOB, or low BP? No Did it involve sudden or severe rash/hives, skin peeling, or any reaction on the inside of your mouth or nose? No Did you need to seek medical attention at a hospital or doctor's office? No When did it last happen? Teenager If all above answers are "NO", may proceed with cephalosporin use.        Medication List        Accurate as of Jul 19, 2023 11:57 AM. If you have any questions, ask your nurse or doctor.          ibuprofen 200 MG tablet Commonly known as: ADVIL Take 400 mg by mouth every 8 (eight) hours as needed (pain.).   multivitamin with minerals Tabs tablet Take 1 tablet by mouth at bedtime.   ondansetron  4 MG disintegrating tablet Commonly known as: ZOFRAN -ODT 4mg  ODT q4 hours prn nausea/vomit   oxyCODONE -acetaminophen  5-325 MG tablet Commonly known as: PERCOCET/ROXICET Take 1 tablet by mouth every 6 (six) hours as needed for severe pain (pain score 7-10).   oxyCODONE -acetaminophen  5-325 MG tablet Commonly known as: PERCOCET/ROXICET Take  1 tablet by mouth every 6 (six) hours as needed for severe pain (pain score 7-10).   PROBIOTIC PO Take 1 capsule by mouth at bedtime.   sulfamethoxazole -trimethoprim  800-160 MG tablet Commonly known as: BACTRIM  DS Take 1 tablet by mouth 2 (two) times daily.        Allergies:  Allergies  Allergen Reactions   Penicillins Nausea And Vomiting    Did it involve swelling of the face/tongue/throat, SOB, or low  BP? No Did it involve sudden or severe rash/hives, skin peeling, or any reaction on the inside of your mouth or nose? No Did you need to seek medical attention at a hospital or doctor's office? No When did it last happen? Teenager If all above answers are "NO", may proceed with cephalosporin use.     Family History: Family History  Problem Relation Age of Onset   Cancer Mother    Cirrhosis Father    Colon polyps Maternal Grandfather    Colon cancer Neg Hx     Social History:  reports that he has been smoking cigarettes. He has a 11.5 pack-year smoking history. He has never used smokeless tobacco. He reports that he does not currently use alcohol. He reports current drug use. Drug: Marijuana.  ROS: All other review of systems were reviewed and are negative except what is noted above in HPI  Physical Exam: BP 112/70   Pulse 64   Constitutional:  Alert and oriented, No acute distress. HEENT: [place] AT, moist mucus membranes.  Trachea midline, no masses. Cardiovascular: No clubbing, cyanosis, or edema. Respiratory: Normal respiratory effort, no increased work of breathing. GI: Abdomen is soft, nontender, nondistended, no abdominal masses GU: No CVA tenderness.  Lymph: No cervical or inguinal lymphadenopathy. Skin: No rashes, bruises or suspicious lesions. Neurologic: Grossly intact, no focal deficits, moving all 4 extremities. Psychiatric: Normal mood and affect.  Laboratory Data: Lab Results  Component Value Date   WBC 10.3 07/17/2023   HGB 14.1 07/17/2023   HCT 42.8 07/17/2023   MCV 95.5 07/17/2023   PLT 282 07/17/2023    Lab Results  Component Value Date   CREATININE 0.94 07/17/2023    No results found for: "PSA"  No results found for: "TESTOSTERONE"  No results found for: "HGBA1C"  Urinalysis    Component Value Date/Time   COLORURINE YELLOW 06/05/2023 1823   APPEARANCEUR Clear 06/30/2023 1048   LABSPEC 1.019 06/05/2023 1823   PHURINE 8.0 06/05/2023 1823    GLUCOSEU Negative 06/30/2023 1048   HGBUR MODERATE (A) 06/05/2023 1823   BILIRUBINUR Negative 06/30/2023 1048   KETONESUR 20 (A) 06/05/2023 1823   PROTEINUR 3+ (A) 06/30/2023 1048   PROTEINUR 100 (A) 06/05/2023 1823   NITRITE Negative 06/30/2023 1048   NITRITE NEGATIVE 06/05/2023 1823   LEUKOCYTESUR Trace (A) 06/30/2023 1048   LEUKOCYTESUR NEGATIVE 06/05/2023 1823    Lab Results  Component Value Date   LABMICR See below: 06/30/2023   WBCUA 6-10 (A) 06/30/2023   LABEPIT 0-10 06/30/2023   MUCUS Present (A) 06/30/2023   BACTERIA None seen 06/30/2023    Pertinent Imaging:  No results found for this or any previous visit.  No results found for this or any previous visit.  No results found for this or any previous visit.  No results found for this or any previous visit.  Results for orders placed during the hospital encounter of 04/06/23  US  RENAL  Narrative CLINICAL DATA:  Left flank pain for 3 months.  EXAM: RENAL / URINARY TRACT  ULTRASOUND COMPLETE  COMPARISON:  CT abdomen pelvis 04/19/2019  FINDINGS: Right Kidney:  Renal measurements: 12.7 x 6.3 x 5.5 cm = volume: 229 mL. Normal renal cortical thickness and echogenicity. No renal mass. Extrarenal pelvis.  Left Kidney:  Renal measurements: 12.8 x 6.4 x 6.6 cm = volume: 282 mL. Normal renal cortical thickness and echogenicity. There is a possible 2 cm stone. Extrarenal pelvis. No renal mass.  Bladder:  Appears normal for degree of bladder distention.  Other:  None.  IMPRESSION: 1. Possible 2 cm left renal stone. 2. No hydronephrosis.   Electronically Signed By: Jone Neither M.D. On: 04/06/2023 09:44  No results found for this or any previous visit.  No results found for this or any previous visit.  Results for orders placed during the hospital encounter of 06/05/23  CT Renal Stone Study  Narrative CLINICAL DATA:  Abdominal/flank pain, stone suspected  EXAM: CT ABDOMEN AND PELVIS WITHOUT  CONTRAST  TECHNIQUE: Multidetector CT imaging of the abdomen and pelvis was performed following the standard protocol without IV contrast.  RADIATION DOSE REDUCTION: This exam was performed according to the departmental dose-optimization program which includes automated exposure control, adjustment of the mA and/or kV according to patient size and/or use of iterative reconstruction technique.  COMPARISON:  April 15, 2023  FINDINGS: Evaluation is limited by lack of IV contrast.  Lower chest: No acute abnormality.  Hepatobiliary: Unremarkable noncontrast appearance of the liver and gallbladder.  Pancreas: No peripancreatic fat stranding.  Spleen: Unremarkable.  Adrenals/Urinary Tract: Adrenal glands are unremarkable. Revisualization of large bilateral extrarenal pelves. No RIGHT-sided hydronephrosis or nephrolithiasis. There is a new mild LEFT-sided hydronephrosis superimposed on this large extrarenal pelvis. Revisualization of multiple LEFT-sided nephrolithiasis sitting within this extrarenal pelvis. A confluence of 2 stones now sits at the origin of the ureter and span in total 17 mm. There is mild adjacent fat stranding. Bladder is unremarkable.  Stomach/Bowel: No evidence of bowel obstruction. Scattered diverticulosis. Appendix is normal. Stomach is unremarkable.  Vascular/Lymphatic: No significant vascular findings are present. No enlarged abdominal or pelvic lymph nodes.  Reproductive: Prostate is unremarkable.  Other: Small fat containing LEFT inguinal hernia.  Musculoskeletal: No acute or significant osseous findings.  IMPRESSION: There is a new mild LEFT-sided hydronephrosis superimposed on a large extrarenal pelvis. There is a confluence of 2 nephrolithiasis now sitting at the origin of the ureter that span in total 17 mm. There is mild adjacent fat stranding.   Electronically Signed By: Clancy Crimes M.D. On: 06/05/2023 19:02   Assessment &  Plan:    1. Hydronephrosis with renal and ureteral calculus obstruction (Primary) Nephrostomy tube removed today Followup 1 week stent removal   No follow-ups on file.  Johnie Nailer, MD  [facility] [place]

## 2023-07-19 NOTE — Telephone Encounter (Signed)
 Patient is aware that per MD no strenuous activity until after his stent is removed and he does not have to get up through the night

## 2023-07-19 NOTE — Telephone Encounter (Signed)
 Patient would like you to answer a few questions for him.    You said in the next 24-48 hours he needs to go every couple hours, does he need to get up at night and go as well?  2.  When can he resume physical activities like golf or sports ?

## 2023-07-22 NOTE — Discharge Summary (Signed)
 Physician Discharge Summary  Patient ID: BRAXLEE MIRCHANDANI MRN: [id number] DOB/AGE: 12-06-1975 48 y.o.  Admit date: 07/15/2023 Discharge date: 07/16/2023  Admission Diagnoses:  Nephrolithiasis  Discharge Diagnoses:  Principal Problem:   Nephrolithiasis   Past Medical History:  Diagnosis Date   Colon polyps 06/16/2019   Nine 5-7 mm polyps, two 18-22 mm polyps, pathology with multiple tubular adenomas without high-grade dysplasia, also hyperplastic rectal polyps   Diverticulitis    History of kidney stones     Surgeries: Procedure(s): NEPHROLITHOTOMY PERCUTANEOUS on 07/15/2023   Consultants (if any):   Discharged Condition: Improved  Hospital Course: TREYVION EDKINS II is an 48 y.o. male who was admitted 07/15/2023 with a diagnosis of Nephrolithiasis and went to the operating room on 07/15/2023 and underwent the above named procedures.    He was given perioperative antibiotics:  Anti-infectives (From admission, onward)    Start     Dose/Rate Route Frequency Ordered Stop   07/15/23 2200  ceFAZolin  (ANCEF ) IVPB 2g/100 mL premix        2 g 200 mL/hr over 30 Minutes Intravenous Every 8 hours 07/15/23 1700 07/16/23 0608   07/15/23 1100  ceFAZolin  (ANCEF ) IVPB 2g/100 mL premix        2 g 200 mL/hr over 30 Minutes Intravenous 30 min pre-op 07/15/23 1015 07/15/23 1311   07/15/23 1016  ceFAZolin  (ANCEF ) 2-4 GM/100ML-% IVPB       Note to Pharmacy: Lind Repine S: cabinet override      07/15/23 1016 07/15/23 1335     .  He was given sequential compression devices, early ambulation for DVT prophylaxis.  He benefited maximally from the hospital stay and there were no complications.    Recent vital signs:  Vitals:   07/16/23 0021 07/16/23 0414  BP: 112/71 119/79  Pulse: 84 86  Resp: 18 16  Temp: 98.6 F (37 C) 97.6 F (36.4 C)  SpO2: 96% 95%    Recent laboratory studies:  Lab Results  Component Value Date   HGB 14.1 07/17/2023   HGB 13.7 07/16/2023   HGB 14.7  07/15/2023   Lab Results  Component Value Date   WBC 10.3 07/17/2023   PLT 282 07/17/2023   Lab Results  Component Value Date   INR 1.0 07/14/2023   Lab Results  Component Value Date   NA 135 07/17/2023   K 3.8 07/17/2023   CL 101 07/17/2023   CO2 26 07/17/2023   BUN 13 07/17/2023   CREATININE 0.94 07/17/2023   GLUCOSE 99 07/17/2023    Discharge Medications:   Allergies as of 07/16/2023       Reactions   Penicillins Nausea And Vomiting   Did it involve swelling of the face/tongue/throat, SOB, or low BP? No Did it involve sudden or severe rash/hives, skin peeling, or any reaction on the inside of your mouth or nose? No Did you need to seek medical attention at a hospital or doctor's office? No When did it last happen? Teenager If all above answers are "NO", may proceed with cephalosporin use.        Medication List     STOP taking these medications    Vitamin D3 50 MCG (2000 UT) Tabs       TAKE these medications    ibuprofen 200 MG tablet Commonly known as: ADVIL Take 400 mg by mouth every 8 (eight) hours as needed (pain.).   multivitamin with minerals Tabs tablet Take 1 tablet by mouth at bedtime.  ondansetron  4 MG disintegrating tablet Commonly known as: ZOFRAN -ODT 4mg  ODT q4 hours prn nausea/vomit   oxyCODONE -acetaminophen  5-325 MG tablet Commonly known as: PERCOCET/ROXICET Take 1 tablet by mouth every 6 (six) hours as needed for severe pain (pain score 7-10).   oxyCODONE -acetaminophen  5-325 MG tablet Commonly known as: PERCOCET/ROXICET Take 1 tablet by mouth every 6 (six) hours as needed for severe pain (pain score 7-10).   PROBIOTIC PO Take 1 capsule by mouth at bedtime.   sulfamethoxazole -trimethoprim  800-160 MG tablet Commonly known as: BACTRIM  DS Take 1 tablet by mouth 2 (two) times daily.        Diagnostic Studies: DG C-Arm 1-60 Min-No Report Result Date: 07/15/2023 Fluoroscopy was utilized by the requesting physician.  No  radiographic interpretation.   IR  NEPHROURETERAL CATH PLACE LEFT Result Date: 07/14/2023 INDICATION: 48 year old male with kidney stones EXAM: IR NEPHROURETERAL CATH PLACEMENT RIGHT COMPARISON:  CT 06/05/2023 MEDICATIONS: Segment 500 mg IV Levaquin ; The antibiotic was administered in an appropriate time frame prior to skin puncture. ANESTHESIA/SEDATION: Fentanyl  50 mcg IV; Versed  2.0 mg IV Moderate Sedation Time:  19 minutes The patient was continuously monitored during the procedure by the interventional radiology nurse under my direct supervision. CONTRAST:  10 cc-administered into the collecting system(s) FLUOROSCOPY TIME:  Fluoroscopy Time:  (17 mGy). COMPLICATIONS: None PROCEDURE: Informed written consent was obtained from the patient after a thorough discussion of the procedural risks, benefits and alternatives. All questions were addressed. Maximal Sterile Barrier Technique was utilized including caps, mask, sterile gowns, sterile gloves, sterile drape, hand hygiene and skin antiseptic. A timeout was performed prior to the initiation of the procedure. Patient positioned prone position on the fluoroscopy table. Ultrasound survey of the left flank was performed with images stored and sent to PACs. The patient was then prepped and draped in the usual sterile fashion. 1% lidocaine  was used to anesthetize the skin and subcutaneous tissues for local anesthesia. A Chiba needle was then used to access a posterior inferior calyx with ultrasound guidance. With spontaneous urine returned through the needle, passage of an 018 micro wire into the collecting system was performed under fluoroscopy. A small incision was made with an 11 blade scalpel, and the needle was removed from the wire. An Accustick system was then advanced over the wire into the collecting system under fluoroscopy. The metal stiffener and inner dilator were removed. Contrast injected confirmed location. Glidewire was advanced into the collecting  system. Ten French dilation was performed. 65 cm Kumpe the catheter was then navigated over the Glidewire and used to navigate the Glidewire into the urinary bladder. Kumpe the catheter was advanced into the urinary bladder. Guidewire was removed and contrast was injected confirming location. Catheter sutured in position.  Final images were stored. Patient tolerated the procedure well and remained hemodynamically stable throughout. No complications were encountered and no significant blood loss encountered FINDINGS: The configuration of the collecting system and ureter is compatible with spectrum of UPJ obstruction/narrowing IMPRESSION: Status post image guided left nephroureteral catheter for pending lithotripsy. Signed, Marciano Settles. Rexine Cater, RPVI [facility] Electronically Signed   By: Myrlene Asper D.O.   On: 07/14/2023 16:18    Disposition: Discharge disposition: 01-Home or Self Care       Discharge Instructions     Discharge patient   Complete by: As directed    Discharge disposition: 01-Home or Self Care   Discharge patient date: 07/16/2023        Follow-up Information  Kyndal Gloster, Arden Beck, MD. Call on 07/19/2023.   Specialty: Urology Contact information: [address]  [place] [address] [phone]                  Signed: Johnie Nailer 07/22/2023, 2:20 PM

## 2023-07-24 LAB — STONE ANALYSIS
Calcium Oxalate Dihydrate: 100 %
Weight Calculi: 1117 mg

## 2023-07-28 ENCOUNTER — Encounter: Payer: Self-pay | Admitting: Urology

## 2023-07-28 ENCOUNTER — Ambulatory Visit (INDEPENDENT_AMBULATORY_CARE_PROVIDER_SITE_OTHER): Admitting: Urology

## 2023-07-28 VITALS — BP 131/80 | HR 89

## 2023-07-28 DIAGNOSIS — Z466 Encounter for fitting and adjustment of urinary device: Secondary | ICD-10-CM | POA: Diagnosis not present

## 2023-07-28 DIAGNOSIS — N131 Hydronephrosis with ureteral stricture, not elsewhere classified: Secondary | ICD-10-CM

## 2023-07-28 DIAGNOSIS — N200 Calculus of kidney: Secondary | ICD-10-CM

## 2023-07-28 MED ORDER — CIPROFLOXACIN HCL 500 MG PO TABS
500.0000 mg | ORAL_TABLET | Freq: Once | ORAL | Status: AC
  Administered 2023-07-28: 500 mg via ORAL

## 2023-07-28 NOTE — Progress Notes (Signed)
   07/28/23  CC: followup nephrolithiasis  HPI: Mr Vanpatten is a 47yo here for ureteral stent removal Blood pressure 131/80, pulse 89. NED. A&Ox3.   No respiratory distress   Abd soft, NT, ND Normal phallus with bilateral descended testicles  Cystoscopy Procedure Note  Patient identification was confirmed, informed consent was obtained, and patient was prepped using Betadine solution.  Lidocaine  jelly was administered per urethral meatus.     Pre-Procedure: - Inspection reveals a normal caliber ureteral meatus.  Procedure: The flexible cystoscope was introduced without difficulty - No urethral strictures/lesions are present. - Normal prostate  - Normal bladder neck - Bilateral ureteral orifices identified - Bladder mucosa  reveals no ulcers, tumors, or lesions - No bladder stones - No trabeculation  Using a grasper the left ureteral stent was removed intact   Post-Procedure: - Patient tolerated the procedure well  Assessment/ Plan: Followup 8 weeks with renal US  and then 3 months with a renogram  No follow-ups on file.  Johnie Nailer, MD

## 2023-07-28 NOTE — Patient Instructions (Signed)

## 2023-08-06 NOTE — Anesthesia Postprocedure Evaluation (Signed)
 Anesthesia Post Note  Patient: NIXXON FARIA II  Procedure(s) Performed: NEPHROLITHOTOMY PERCUTANEOUS (Left: Renal)  Patient location during evaluation: Phase II Anesthesia Type: General Level of consciousness: awake Pain management: pain level controlled Vital Signs Assessment: post-procedure vital signs reviewed and stable Respiratory status: spontaneous breathing and respiratory function stable Cardiovascular status: blood pressure returned to baseline and stable Postop Assessment: no headache and no apparent nausea or vomiting Anesthetic complications: no Comments: Late entry   No notable events documented.   Last Vitals:  Vitals:   07/16/23 0021 07/16/23 0414  BP: 112/71 119/79  Pulse: 84 86  Resp: 18 16  Temp: 37 C 36.4 C  SpO2: 96% 95%    Last Pain:  Vitals:   07/16/23 1606  TempSrc:   PainSc: 4                  Coretha Dew

## 2023-08-11 ENCOUNTER — Telehealth: Admitting: Physician Assistant

## 2023-08-11 DIAGNOSIS — W57XXXA Bitten or stung by nonvenomous insect and other nonvenomous arthropods, initial encounter: Secondary | ICD-10-CM | POA: Diagnosis not present

## 2023-08-11 DIAGNOSIS — S30861A Insect bite (nonvenomous) of abdominal wall, initial encounter: Secondary | ICD-10-CM | POA: Diagnosis not present

## 2023-08-11 MED ORDER — DOXYCYCLINE HYCLATE 100 MG PO TABS: ORAL_TABLET | ORAL | 0 refills | Status: AC

## 2023-08-11 NOTE — Progress Notes (Signed)
 Virtual Visit Consent   Wayne Peterson, you are scheduled for a virtual visit with a [facility] provider today. Just as with appointments in the office, your consent must be obtained to participate. Your consent will be active for this visit and any virtual visit you may have with one of our providers in the next 365 days. If you have a MyChart account, a copy of this consent can be sent to you electronically.  As this is a virtual visit, video technology does not allow for your provider to perform a traditional examination. This may limit your provider's ability to fully assess your condition. If your provider identifies any concerns that need to be evaluated in person or the need to arrange testing (such as labs, EKG, etc.), we will make arrangements to do so. Although advances in technology are sophisticated, we cannot ensure that it will always work on either your end or our end. If the connection with a video visit is poor, the visit may have to be switched to a telephone visit. With either a video or telephone visit, we are not always able to ensure that we have a secure connection.  By engaging in this virtual visit, you consent to the provision of healthcare and authorize for your insurance to be billed (if applicable) for the services provided during this visit. Depending on your insurance coverage, you may receive a charge related to this service.  I need to obtain your verbal consent now. Are you willing to proceed with your visit today? Wayne Peterson has provided verbal consent on 08/11/2023 for a virtual visit (video or telephone). Wanita Gutta, [place]  Date: 08/11/2023 5:56 PM   Virtual Visit via Video Note   I, Wanita Gutta, connected with  Wayne Peterson  ([id number], 03/07/1976) on 08/11/23 at  5:45 PM EDT by a video-enabled telemedicine application and verified that I am speaking with the correct person using two identifiers.  Location: Patient: Virtual Visit Location  Patient: Mobile Provider: Virtual Visit Location Provider: Home Office   I discussed the limitations of evaluation and management by telemedicine and the availability of in person appointments. The patient expressed understanding and agreed to proceed.    History of Present Illness: Wayne Peterson is a 48 y.o. who identifies as a male who was assigned male at birth, and is being seen today for tick bite.  HPI: 48y/o M presents for a telehealth video visit for c/o a tick bite on his upper abdomen two days ago which he removed within few hours of being bitten. Currently with a "quarter size" red area with swelling. Slight drainage noticed earlier today when he scratched it.      Problems:  Patient Active Problem List   Diagnosis Date Noted   Nephrolithiasis 03/26/2023   History of adenomatous polyp of colon 07/28/2019   Diverticulitis of colon 04/24/2019    Allergies:  Allergies  Allergen Reactions   Penicillins Nausea And Vomiting    Did it involve swelling of the face/tongue/throat, SOB, or low BP? No Did it involve sudden or severe rash/hives, skin peeling, or any reaction on the inside of your mouth or nose? No Did you need to seek medical attention at a hospital or doctor's office? No When did it last happen? Teenager If all above answers are "NO", may proceed with cephalosporin use.    Medications:  Current Outpatient Medications:    doxycycline (VIBRA-TABS) 100 MG tablet, Take two tablets(200mg  total) by  mouth once., Disp: 2 tablet, Rfl: 0   ibuprofen (ADVIL) 200 MG tablet, Take 400 mg by mouth every 8 (eight) hours as needed (pain.)., Disp: , Rfl:    Multiple Vitamin (MULTIVITAMIN WITH MINERALS) TABS tablet, Take 1 tablet by mouth at bedtime., Disp: , Rfl:    ondansetron  (ZOFRAN -ODT) 4 MG disintegrating tablet, 4mg  ODT q4 hours prn nausea/vomit, Disp: 30 tablet, Rfl: 0   oxyCODONE -acetaminophen  (PERCOCET/ROXICET) 5-325 MG tablet, Take 1 tablet by mouth every 6 (six)  hours as needed for severe pain (pain score 7-10)., Disp: 30 tablet, Rfl: 0   oxyCODONE -acetaminophen  (PERCOCET/ROXICET) 5-325 MG tablet, Take 1 tablet by mouth every 6 (six) hours as needed for severe pain (pain score 7-10)., Disp: 30 tablet, Rfl: 0   Probiotic Product (PROBIOTIC PO), Take 1 capsule by mouth at bedtime., Disp: , Rfl:   Observations/Objective: Patient is well-developed, well-nourished in no acute distress.  Resting comfortably in his automobile.  Head is normocephalic, atraumatic.  No labored breathing.  Speech is clear and coherent with logical content.  Patient is alert and oriented at baseline.    Assessment and Plan: 1. Tick bite of abdomen, initial encounter (Primary) - doxycycline (VIBRA-TABS) 100 MG tablet; Take two tablets(200mg  total) by mouth once.  Dispense: 2 tablet; Refill: 0  Start Doxycycline as prescribed. Apply topical ointment ie Bacitracin to the affected area twice daily. Keep checking for worsening symptoms including increase in redness, swelling, drainage, fever, etc then follow up at [facility] clinic for further evaluation. Schedule a virtual appointment or follow up at an urgent care clinic if symptoms don't improve.  Pt verbalized understanding and in agreement.     Follow Up Instructions: I discussed the assessment and treatment plan with the patient. The patient was provided an opportunity to ask questions and all were answered. The patient agreed with the plan and demonstrated an understanding of the instructions.  A copy of instructions were sent to the patient via MyChart unless otherwise noted below.   Patient has requested to receive PHI (AVS, Work Notes, etc) pertaining to this video visit through e-mail as they are currently without active MyChart. They have voiced understand that email is not considered secure and their health information could be viewed by someone other than the patient.   The patient was advised to call back or seek an  in-person evaluation if the symptoms worsen or if the condition fails to improve as anticipated.    Leodan Bolyard, PA-C

## 2023-09-21 ENCOUNTER — Ambulatory Visit (HOSPITAL_COMMUNITY)
Admission: RE | Admit: 2023-09-21 | Discharge: 2023-09-21 | Disposition: A | Discharge status: Home / Self Care | Source: Ambulatory Visit | Attending: Urology | Admitting: Urology

## 2023-09-21 DIAGNOSIS — N200 Calculus of kidney: Secondary | ICD-10-CM | POA: Insufficient documentation

## 2023-09-21 DIAGNOSIS — Z0389 Encounter for observation for other suspected diseases and conditions ruled out: Secondary | ICD-10-CM | POA: Diagnosis not present

## 2023-09-28 ENCOUNTER — Ambulatory Visit: Payer: Self-pay | Admitting: Urology

## 2023-10-13 ENCOUNTER — Encounter (HOSPITAL_COMMUNITY): Payer: Self-pay

## 2023-10-13 ENCOUNTER — Encounter (HOSPITAL_COMMUNITY)
Admission: RE | Admit: 2023-10-13 | Discharge: 2023-10-13 | Disposition: A | Discharge status: Home / Self Care | Source: Ambulatory Visit | Attending: Urology | Admitting: Urology

## 2023-10-13 DIAGNOSIS — N2889 Other specified disorders of kidney and ureter: Secondary | ICD-10-CM | POA: Diagnosis not present

## 2023-10-13 DIAGNOSIS — R935 Abnormal findings on diagnostic imaging of other abdominal regions, including retroperitoneum: Secondary | ICD-10-CM | POA: Diagnosis not present

## 2023-10-13 DIAGNOSIS — N1330 Unspecified hydronephrosis: Secondary | ICD-10-CM | POA: Diagnosis not present

## 2023-10-13 DIAGNOSIS — N131 Hydronephrosis with ureteral stricture, not elsewhere classified: Secondary | ICD-10-CM | POA: Diagnosis present

## 2023-10-13 MED ORDER — TECHNETIUM TC 99M MERTIATIDE
5.0000 | Freq: Once | INTRAVENOUS | Status: AC | PRN
  Administered 2023-10-13: 5.2 via INTRAVENOUS

## 2023-10-13 MED ORDER — FUROSEMIDE 10 MG/ML IJ SOLN
40.0000 mg | Freq: Once | INTRAMUSCULAR | Status: AC
  Administered 2023-10-13: 40 mg via INTRAVENOUS

## 2023-10-13 MED ORDER — FUROSEMIDE 10 MG/ML IJ SOLN
INTRAMUSCULAR | Status: AC
  Filled 2023-10-13: qty 4

## 2023-10-15 ENCOUNTER — Ambulatory Visit: Admitting: Urology

## 2023-10-22 ENCOUNTER — Ambulatory Visit: Admitting: Urology

## 2024-01-03 ENCOUNTER — Other Ambulatory Visit: Payer: Self-pay

## 2024-01-05 ENCOUNTER — Ambulatory Visit: Admitting: Urology

## 2024-01-05 DIAGNOSIS — N132 Hydronephrosis with renal and ureteral calculous obstruction: Secondary | ICD-10-CM

## 2024-01-10 ENCOUNTER — Encounter: Payer: Self-pay | Admitting: Radiology

## 2024-03-30 ENCOUNTER — Ambulatory Visit: Payer: 59 | Admitting: Urology

## 2024-06-23 ENCOUNTER — Ambulatory Visit: Admitting: Urology
